# Patient Record
Sex: Female | Born: 1955 | Race: White | Hispanic: No | Marital: Married | Smoking: Never smoker
Health system: Southern US, Community
[De-identification: ages and names within clinical notes are randomized; demographics above are authoritative.]

## PROBLEM LIST (undated history)

## (undated) DIAGNOSIS — F1020 Alcohol dependence, uncomplicated: Secondary | ICD-10-CM

## (undated) DIAGNOSIS — D649 Anemia, unspecified: Secondary | ICD-10-CM

## (undated) DIAGNOSIS — F419 Anxiety disorder, unspecified: Secondary | ICD-10-CM

## (undated) DIAGNOSIS — K2270 Barrett's esophagus without dysplasia: Secondary | ICD-10-CM

## (undated) DIAGNOSIS — K219 Gastro-esophageal reflux disease without esophagitis: Secondary | ICD-10-CM

## (undated) DIAGNOSIS — K7460 Unspecified cirrhosis of liver: Secondary | ICD-10-CM

## (undated) HISTORY — DX: Barrett's esophagus without dysplasia: K22.70

## (undated) HISTORY — DX: Anxiety disorder, unspecified: F41.9

## (undated) HISTORY — DX: Anemia, unspecified: D64.9

## (undated) HISTORY — DX: Gastro-esophageal reflux disease without esophagitis: K21.9

---

## 1997-10-11 HISTORY — PX: APPENDECTOMY: SHX54

## 2003-10-12 HISTORY — PX: BILATERAL SALPINGOOPHORECTOMY: SHX1223

## 2003-10-12 HISTORY — PX: LAPAROSCOPIC ASSISTED VAGINAL HYSTERECTOMY: SHX5398

## 2007-10-17 ENCOUNTER — Ambulatory Visit: Payer: Self-pay | Admitting: Internal Medicine

## 2007-11-16 ENCOUNTER — Ambulatory Visit: Payer: Self-pay | Admitting: Internal Medicine

## 2007-11-16 DIAGNOSIS — Z8669 Personal history of other diseases of the nervous system and sense organs: Secondary | ICD-10-CM | POA: Insufficient documentation

## 2007-11-16 DIAGNOSIS — R12 Heartburn: Secondary | ICD-10-CM | POA: Insufficient documentation

## 2008-02-29 ENCOUNTER — Encounter: Payer: Self-pay | Admitting: Internal Medicine

## 2008-03-05 ENCOUNTER — Ambulatory Visit: Payer: Self-pay | Admitting: Hematology and Oncology

## 2008-03-10 ENCOUNTER — Encounter: Admission: RE | Admit: 2008-03-10 | Discharge: 2008-03-10 | Payer: Self-pay | Admitting: Family Medicine

## 2008-03-11 ENCOUNTER — Encounter: Admission: RE | Admit: 2008-03-11 | Discharge: 2008-03-11 | Payer: Self-pay | Admitting: Family Medicine

## 2008-03-15 ENCOUNTER — Encounter: Payer: Self-pay | Admitting: Internal Medicine

## 2008-03-15 LAB — CBC WITH DIFFERENTIAL/PLATELET
BASO%: 0.6 % (ref 0.0–2.0)
Eosinophils Absolute: 0.1 10*3/uL (ref 0.0–0.5)
MONO#: 0.4 10*3/uL (ref 0.1–0.9)
MONO%: 7.1 % (ref 0.0–13.0)
NEUT#: 3.9 10*3/uL (ref 1.5–6.5)
RBC: 3.78 10*6/uL (ref 3.70–5.32)
RDW: 13 % (ref 11.3–14.5)
WBC: 5.7 10*3/uL (ref 3.9–10.0)

## 2008-03-19 ENCOUNTER — Ambulatory Visit: Payer: Self-pay | Admitting: Internal Medicine

## 2008-03-19 DIAGNOSIS — R945 Abnormal results of liver function studies: Secondary | ICD-10-CM

## 2008-03-19 DIAGNOSIS — R7989 Other specified abnormal findings of blood chemistry: Secondary | ICD-10-CM | POA: Insufficient documentation

## 2008-03-19 DIAGNOSIS — R740 Nonspecific elevation of levels of transaminase and lactic acid dehydrogenase [LDH]: Secondary | ICD-10-CM

## 2008-03-19 DIAGNOSIS — K219 Gastro-esophageal reflux disease without esophagitis: Secondary | ICD-10-CM

## 2008-03-19 DIAGNOSIS — R7402 Elevation of levels of lactic acid dehydrogenase (LDH): Secondary | ICD-10-CM | POA: Insufficient documentation

## 2008-03-21 LAB — CBC WITH DIFFERENTIAL/PLATELET
Eosinophils Absolute: 0.1 10*3/uL (ref 0.0–0.5)
LYMPH%: 24 % (ref 14.0–48.0)
MONO#: 0.6 10*3/uL (ref 0.1–0.9)
NEUT#: 5.1 10*3/uL (ref 1.5–6.5)
Platelets: 249 10*3/uL (ref 145–400)
RBC: 3.66 10*6/uL — ABNORMAL LOW (ref 3.70–5.32)
WBC: 7.7 10*3/uL (ref 3.9–10.0)
lymph#: 1.9 10*3/uL (ref 0.9–3.3)

## 2008-03-22 LAB — HEMOCHROMATOSIS DNA-PCR(C282Y,H63D)

## 2008-03-22 LAB — HEPATITIS A ANTIBODY, TOTAL: Hep A Total Ab: NEGATIVE

## 2008-03-22 LAB — HEPATITIS B CORE ANTIBODY, TOTAL: Hep B Core Total Ab: NEGATIVE

## 2008-03-22 LAB — HEPATITIS C ANTIBODY: HCV Ab: NEGATIVE

## 2008-03-23 ENCOUNTER — Encounter: Admission: RE | Admit: 2008-03-23 | Discharge: 2008-03-23 | Payer: Self-pay | Admitting: Hematology and Oncology

## 2008-03-28 LAB — CBC WITH DIFFERENTIAL/PLATELET
Basophils Absolute: 0.1 10*3/uL (ref 0.0–0.1)
Eosinophils Absolute: 0.2 10*3/uL (ref 0.0–0.5)
HCT: 35.6 % (ref 34.8–46.6)
HGB: 12.4 g/dL (ref 11.6–15.9)
LYMPH%: 24.7 % (ref 14.0–48.0)
MCHC: 34.9 g/dL (ref 32.0–36.0)
MONO#: 0.5 10*3/uL (ref 0.1–0.9)
NEUT#: 5.5 10*3/uL (ref 1.5–6.5)
NEUT%: 66.5 % (ref 39.6–76.8)
Platelets: 297 10*3/uL (ref 145–400)
WBC: 8.2 10*3/uL (ref 3.9–10.0)
lymph#: 2 10*3/uL (ref 0.9–3.3)

## 2008-03-29 ENCOUNTER — Telehealth: Payer: Self-pay | Admitting: Internal Medicine

## 2008-04-01 LAB — CBC WITH DIFFERENTIAL/PLATELET
BASO%: 0.5 % (ref 0.0–2.0)
Basophils Absolute: 0 10*3/uL (ref 0.0–0.1)
EOS%: 2.3 % (ref 0.0–7.0)
HCT: 33.4 % — ABNORMAL LOW (ref 34.8–46.6)
HGB: 11.6 g/dL (ref 11.6–15.9)
LYMPH%: 25 % (ref 14.0–48.0)
MCH: 34.5 pg — ABNORMAL HIGH (ref 26.0–34.0)
MCHC: 34.7 g/dL (ref 32.0–36.0)
NEUT%: 68.1 % (ref 39.6–76.8)
Platelets: 268 10*3/uL (ref 145–400)

## 2008-04-04 LAB — CBC WITH DIFFERENTIAL/PLATELET
Basophils Absolute: 0 10*3/uL (ref 0.0–0.1)
EOS%: 4 % (ref 0.0–7.0)
Eosinophils Absolute: 0.2 10*3/uL (ref 0.0–0.5)
HGB: 11.3 g/dL — ABNORMAL LOW (ref 11.6–15.9)
LYMPH%: 34.5 % (ref 14.0–48.0)
MCH: 34.6 pg — ABNORMAL HIGH (ref 26.0–34.0)
MCV: 101 fL (ref 81.0–101.0)
MONO%: 7.3 % (ref 0.0–13.0)
NEUT#: 3.3 10*3/uL (ref 1.5–6.5)
Platelets: 258 10*3/uL (ref 145–400)
RBC: 3.26 10*6/uL — ABNORMAL LOW (ref 3.70–5.32)
RDW: 11.9 % (ref 11.3–14.5)

## 2008-05-05 ENCOUNTER — Ambulatory Visit: Payer: Self-pay | Admitting: Hematology and Oncology

## 2008-05-09 ENCOUNTER — Encounter: Payer: Self-pay | Admitting: Internal Medicine

## 2008-05-09 ENCOUNTER — Ambulatory Visit: Payer: Self-pay | Admitting: Internal Medicine

## 2008-05-10 LAB — CONVERTED CEMR LAB
ALT: 217 units/L — ABNORMAL HIGH (ref 0–35)
AST: 176 units/L — ABNORMAL HIGH (ref 0–37)
Albumin: 3.8 g/dL (ref 3.5–5.2)
Alkaline Phosphatase: 66 units/L (ref 39–117)
Bilirubin, Direct: 0.1 mg/dL (ref 0.0–0.3)
Total Bilirubin: 0.8 mg/dL (ref 0.3–1.2)
Total Protein: 6.7 g/dL (ref 6.0–8.3)

## 2008-05-15 ENCOUNTER — Encounter: Payer: Self-pay | Admitting: Internal Medicine

## 2008-05-20 ENCOUNTER — Telehealth: Payer: Self-pay | Admitting: Internal Medicine

## 2008-05-22 ENCOUNTER — Encounter: Payer: Self-pay | Admitting: Internal Medicine

## 2008-05-24 ENCOUNTER — Ambulatory Visit: Payer: Self-pay | Admitting: Internal Medicine

## 2008-05-30 ENCOUNTER — Telehealth: Payer: Self-pay | Admitting: Internal Medicine

## 2008-05-31 LAB — CONVERTED CEMR LAB
Albumin ELP: 60 % (ref 55.8–66.1)
Alpha-1-Globulin: 4.3 % (ref 2.9–4.9)
Alpha-2-Globulin: 9.7 % (ref 7.1–11.8)
Anti Nuclear Antibody(ANA): NEGATIVE
Beta Globulin: 6.7 % (ref 4.7–7.2)
Ceruloplasmin: 36 mg/dL (ref 21–63)
Gamma Globulin: 14.3 % (ref 11.1–18.8)
Total Protein, Serum Electrophoresis: 7.4 g/dL (ref 6.0–8.3)

## 2008-06-04 ENCOUNTER — Telehealth: Payer: Self-pay | Admitting: Internal Medicine

## 2008-06-12 ENCOUNTER — Telehealth: Payer: Self-pay | Admitting: Internal Medicine

## 2008-06-14 ENCOUNTER — Ambulatory Visit: Payer: Self-pay | Admitting: Internal Medicine

## 2008-06-18 ENCOUNTER — Telehealth (INDEPENDENT_AMBULATORY_CARE_PROVIDER_SITE_OTHER): Payer: Self-pay

## 2008-06-18 LAB — CONVERTED CEMR LAB
ALT: 186 units/L — ABNORMAL HIGH (ref 0–35)
AST: 144 units/L — ABNORMAL HIGH (ref 0–37)
Albumin: 4.1 g/dL (ref 3.5–5.2)
Alkaline Phosphatase: 76 units/L (ref 39–117)
Bilirubin, Direct: 0.1 mg/dL (ref 0.0–0.3)
Folate: 12 ng/mL
Iron: 46 ug/dL (ref 42–145)
Total Bilirubin: 0.8 mg/dL (ref 0.3–1.2)
Total Protein: 7.2 g/dL (ref 6.0–8.3)
Vitamin B-12: 364 pg/mL (ref 211–911)

## 2008-06-21 ENCOUNTER — Ambulatory Visit: Payer: Self-pay | Admitting: Internal Medicine

## 2008-06-24 LAB — CONVERTED CEMR LAB
ALT: 120 units/L — ABNORMAL HIGH (ref 0–35)
AST: 85 units/L — ABNORMAL HIGH (ref 0–37)
Albumin: 4 g/dL (ref 3.5–5.2)
Alkaline Phosphatase: 72 units/L (ref 39–117)
Bilirubin, Direct: 0.1 mg/dL (ref 0.0–0.3)
Ferritin: 122.2 ng/mL (ref 10.0–291.0)
Iron: 70 ug/dL (ref 42–145)
Saturation Ratios: 19.1 % — ABNORMAL LOW (ref 20.0–50.0)
Total Bilirubin: 0.9 mg/dL (ref 0.3–1.2)
Total Protein: 7 g/dL (ref 6.0–8.3)
Transferrin: 261.8 mg/dL (ref >=212.0)

## 2008-08-01 ENCOUNTER — Ambulatory Visit: Payer: Self-pay | Admitting: Internal Medicine

## 2008-08-02 ENCOUNTER — Telehealth: Payer: Self-pay | Admitting: Internal Medicine

## 2008-08-07 ENCOUNTER — Ambulatory Visit: Payer: Self-pay | Admitting: Internal Medicine

## 2008-08-07 ENCOUNTER — Encounter: Payer: Self-pay | Admitting: Internal Medicine

## 2008-08-09 LAB — CONVERTED CEMR LAB
ALT: 106 units/L — ABNORMAL HIGH (ref 0–35)
AST: 71 units/L — ABNORMAL HIGH (ref 0–37)
Albumin: 3.8 g/dL (ref 3.5–5.2)
Alkaline Phosphatase: 69 units/L (ref 39–117)
Bilirubin, Direct: 0.1 mg/dL (ref 0.0–0.3)
Total Bilirubin: 1 mg/dL (ref 0.3–1.2)
Total Protein: 7.1 g/dL (ref 6.0–8.3)

## 2008-08-15 ENCOUNTER — Encounter: Payer: Self-pay | Admitting: Internal Medicine

## 2009-08-18 ENCOUNTER — Emergency Department (HOSPITAL_COMMUNITY): Admission: EM | Admit: 2009-08-18 | Discharge: 2009-08-18 | Payer: Self-pay | Admitting: Emergency Medicine

## 2010-03-31 ENCOUNTER — Ambulatory Visit (HOSPITAL_COMMUNITY): Admission: RE | Admit: 2010-03-31 | Discharge: 2010-03-31 | Payer: Self-pay | Admitting: Psychiatry

## 2010-04-22 ENCOUNTER — Other Ambulatory Visit (HOSPITAL_COMMUNITY): Admission: RE | Admit: 2010-04-22 | Discharge: 2010-06-04 | Payer: Self-pay | Admitting: Psychiatry

## 2010-04-27 ENCOUNTER — Ambulatory Visit: Payer: Self-pay | Admitting: Psychiatry

## 2010-06-08 ENCOUNTER — Other Ambulatory Visit (HOSPITAL_COMMUNITY): Admission: RE | Admit: 2010-06-08 | Discharge: 2010-07-15 | Payer: Self-pay | Admitting: Psychiatry

## 2010-06-08 ENCOUNTER — Emergency Department (HOSPITAL_COMMUNITY): Admission: EM | Admit: 2010-06-08 | Discharge: 2010-06-08 | Payer: Self-pay | Admitting: Emergency Medicine

## 2010-07-28 ENCOUNTER — Emergency Department (HOSPITAL_COMMUNITY): Admission: EM | Admit: 2010-07-28 | Discharge: 2010-07-29 | Payer: Self-pay | Admitting: Emergency Medicine

## 2010-11-10 NOTE — Miscellaneous (Signed)
Summary: lab lfts  Clinical Lists Changes  Problems: Added new problem of NONSPEC ELEVATION OF LEVELS OF TRANSAMINASE/LDH (ICD-790.4) Orders: Added new Test order of TLB-Hepatic/Liver Function Pnl (80076-HEPATIC) - Signed

## 2010-12-16 ENCOUNTER — Other Ambulatory Visit: Payer: Self-pay | Admitting: Dermatology

## 2010-12-24 LAB — URINE DRUGS OF ABUSE SCREEN W ALC, ROUTINE (REF LAB)
Amphetamine Screen, Ur: NEGATIVE
Amphetamine Screen, Ur: NEGATIVE
Amphetamine Screen, Ur: NEGATIVE
Amphetamine Screen, Ur: NEGATIVE
Barbiturate Quant, Ur: NEGATIVE
Barbiturate Quant, Ur: NEGATIVE
Barbiturate Quant, Ur: NEGATIVE
Barbiturate Quant, Ur: NEGATIVE
Benzodiazepines.: POSITIVE — AB
Benzodiazepines.: NEGATIVE
Benzodiazepines.: NEGATIVE
Benzodiazepines.: NEGATIVE
Cocaine Metabolites: NEGATIVE
Cocaine Metabolites: NEGATIVE
Cocaine Metabolites: NEGATIVE
Cocaine Metabolites: NEGATIVE
Creatinine,U: 193.1 mg/dL
Creatinine,U: 35.8 mg/dL
Creatinine,U: 52.9 mg/dL
Creatinine,U: 61.1 mg/dL
Ethyl Alcohol: 10 mg/dL — ABNORMAL HIGH (ref <10)
Ethyl Alcohol: <10 mg/dL (ref <10)
Ethyl Alcohol: <10 mg/dL (ref <10)
Ethyl Alcohol: <10 mg/dL (ref <10)
Marijuana Metabolite: NEGATIVE
Marijuana Metabolite: NEGATIVE
Marijuana Metabolite: NEGATIVE
Marijuana Metabolite: NEGATIVE
Methadone: NEGATIVE
Methadone: NEGATIVE
Methadone: NEGATIVE
Methadone: NEGATIVE
Opiate Screen, Urine: NEGATIVE
Opiate Screen, Urine: NEGATIVE
Opiate Screen, Urine: NEGATIVE
Opiate Screen, Urine: NEGATIVE
Phencyclidine (PCP): NEGATIVE
Phencyclidine (PCP): NEGATIVE
Phencyclidine (PCP): NEGATIVE
Phencyclidine (PCP): NEGATIVE
Propoxyphene: NEGATIVE
Propoxyphene: NEGATIVE
Propoxyphene: NEGATIVE
Propoxyphene: NEGATIVE

## 2010-12-24 LAB — ETHANOL CONFIRM, URINE: Ethanol, Ur - Confirmation: 0.013 GMS% (ref ?–0.04)

## 2010-12-24 LAB — BENZODIAZEPINE, QUANTITATIVE, URINE
Alprazolam (GC/LC/MS), ur confirm: NEGATIVE NG/ML
Flurazepam GC/MS Conf: NEGATIVE NG/ML
Lorazepam UR QT: NEGATIVE NG/ML
Nordiazepam GC/MS Conf: NEGATIVE NG/ML
Oxazepam GC/MS Conf: NEGATIVE NG/ML
Temazepam GC/MS Conf: NEGATIVE NG/ML

## 2010-12-25 LAB — URINALYSIS, ROUTINE W REFLEX MICROSCOPIC
Bilirubin Urine: NEGATIVE
Glucose, UA: NEGATIVE mg/dL
Hgb urine dipstick: NEGATIVE
Ketones, ur: NEGATIVE mg/dL
Nitrite: NEGATIVE
Protein, ur: NEGATIVE mg/dL
Specific Gravity, Urine: 1.015 (ref 1.005–1.030)
Urobilinogen, UA: 1 mg/dL (ref 0.0–1.0)
pH: 7.5 (ref 5.0–8.0)

## 2010-12-25 LAB — HEPATIC FUNCTION PANEL
AST: 71 U/L — ABNORMAL HIGH (ref 0–37)
Bilirubin, Direct: 0.3 mg/dL (ref 0.0–0.3)

## 2010-12-25 LAB — RAPID URINE DRUG SCREEN, HOSP PERFORMED
Amphetamines: NOT DETECTED
Barbiturates: NOT DETECTED
Benzodiazepines: POSITIVE — AB
Cocaine: NOT DETECTED
Opiates: NOT DETECTED
Tetrahydrocannabinol: NOT DETECTED

## 2010-12-25 LAB — BASIC METABOLIC PANEL
BUN: 5 mg/dL — ABNORMAL LOW (ref 6–23)
CO2: 30 mEq/L (ref 19–32)
Calcium: 9.3 mg/dL (ref 8.4–10.5)
Chloride: 99 mEq/L (ref 96–112)
Creatinine, Ser: 0.67 mg/dL (ref 0.4–1.2)
GFR calc Af Amer: >60 mL/min (ref >60)
GFR calc non Af Amer: >60 mL/min (ref >60)
Glucose, Bld: 115 mg/dL — ABNORMAL HIGH (ref 70–99)
Potassium: 3.6 mEq/L (ref 3.5–5.1)
Sodium: 138 mEq/L (ref 135–145)

## 2010-12-25 LAB — URINE DRUGS OF ABUSE SCREEN W ALC, ROUTINE (REF LAB)
Amphetamine Screen, Ur: NEGATIVE
Cocaine Metabolites: NEGATIVE
Creatinine,U: 68.4 mg/dL
Methadone: NEGATIVE
Opiate Screen, Urine: NEGATIVE

## 2010-12-25 LAB — CBC
HCT: 37.2 % (ref 36.0–46.0)
MCHC: 34.6 g/dL (ref 30.0–36.0)
Platelets: 112 10*3/uL — ABNORMAL LOW (ref 150–400)
RDW: 16.3 % — ABNORMAL HIGH (ref 11.5–15.5)

## 2010-12-25 LAB — URINE MICROSCOPIC-ADD ON

## 2010-12-25 LAB — DIFFERENTIAL
Basophils Absolute: 0 10*3/uL (ref 0.0–0.1)
Basophils Relative: 1 % (ref 0–1)
Monocytes Absolute: 0.3 10*3/uL (ref 0.1–1.0)
Neutro Abs: 2.6 10*3/uL (ref 1.7–7.7)
Neutrophils Relative %: 63 % (ref 43–77)

## 2010-12-25 LAB — ETHANOL: Alcohol, Ethyl (B): <5 mg/dL (ref 0–10)

## 2010-12-26 LAB — URINE DRUGS OF ABUSE SCREEN W ALC, ROUTINE (REF LAB)
Amphetamine Screen, Ur: NEGATIVE
Amphetamine Screen, Ur: NEGATIVE
Cocaine Metabolites: NEGATIVE
Cocaine Metabolites: NEGATIVE
Creatinine,U: 18.3 mg/dL
Creatinine,U: 25.6 mg/dL
Ethyl Alcohol: <10 mg/dL (ref <10)
Opiate Screen, Urine: NEGATIVE
Opiate Screen, Urine: NEGATIVE

## 2011-02-23 NOTE — Assessment & Plan Note (Signed)
[facility]                         GASTROENTEROLOGY OFFICE NOTE   Heather Baldwin, Heather Baldwin                           MRN:          [id number]  DATE:11/16/2007                            DOB:          05-27-1956    CHIEF COMPLAINT:  Heartburn.   HISTORY:  This is a 55 year old white woman who moved here from [place] last summer, who presented for screening colonoscopy at the  direction of Dr. Collins Baldwin.  However, she scheduled an office visit to  discuss intermittent heartburn problems.  She has had this for about a  year.  Tums will reliably relieve it.  She does drink 3-4 cups of tea in  the morning and some iced tea.  There is no nicotine or smoking, or  other habits like that.  There is no dysphagia or bleeding.  She  describes some occasional gas and bloating.  Note that the heartburn may  be a little increased over the last year.  She is concerned because her  mother did have esophageal cancer, though her mother was a smoker, this  still has caught Heather Baldwin's attention.  She occasionally has had some  hemorrhoid problems, though not a problem at this time.   GI review of systems is otherwise negative per my medical history form.   PAST MEDICAL HISTORY:  1. Complete hysterectomy in 2005.  2. Appendectomy in 1999.   MEDICATIONS:  Multivitamin p.r.n.   ALLERGIES:  There are no known drug allergies.   FAMILY HISTORY:  Otherwise positive for heart disease in her father.   SOCIAL HISTORY:  She is married.  She is a housewife.  She has a  Master's degree, 3 daughters.  She does drink some red wine daily which  may promote some heartburn at times.  Otherwise no tobacco or drug use.   REVIEW OF SYSTEMS:  Occasional night sweats.  She does have a history of  some neuropathy, which has caused some swelling of the legs, she says.  All other systems are negative.   PHYSICAL EXAMINATION:  Well-developed, well-nourished middle aged white  woman.  Height 5  feet 7 inches.  Weight 166 pounds.  Blood pressure  134/88.  Pulse 76.  The eyes aer anicteric.  ENT, normal mouth.  Posterior pharynx, auditory  acuity is normal.  NECK:  Supple without thyromegaly or mass.  CHEST:  Clear.  HEART:  S1 and S2, no murmurs or gallops.  ABDOMEN:  Soft, nontender, no organomegaly or mass.  RECTAL:  Deferred until colonoscopy.  Note that on abdominal exam there  is a very early ventral hernia in the epigastrium and she has noted this  and she is reassured.  LYMPHATIC:  No neck or supraclavicular nodes.  LOWER EXTREMITIES:  I note no edema today.  SKIN:  She does have some erythema and some rosacea-like appearance to  the facies.  NEUROLOGIC:  Cranial nerves 2-12 grossly intact, nonfocal.  PSYCHIATRIC:  She is alert and oriented x3, appropriate mood and affect.   Review of Dr. Alda Berthold referral records show that she has had a recent  visit for right ear pain, September 27, 2007 and she had URI and  pharyngitis.  It was at that point she was referred for colonoscopy.   ASSESSMENT:  1. One year's worth of intermittent heartburn.  This is in the setting      of a family history of esophageal cancer.  2. [age] years of age, appropriate for average risk screening      colonoscopy.   PLAN:  1. Schedule EGD to investigate the heartburn, in light of the family      history of esophageal cancer.  We would want to make sure she does      not have Barrett's esophagus, as she is probably at higher risk for      this, given the family history.  2. Schedule screening colonoscopy.   Risks, benefits, and indications have been explained.  She will be given  a GERD lifestyle handout, where she can work on caffeine consumption.  Prescriptive therapy for GERD can be decided later, though she only uses  Tums once or twice a week.  Dietary modification may make a difference  depending upon what we see.   I appreciate the opportunity to care for this patient.      Iva Boop, MD,FACG  Electronically Signed    CEG/MedQ  DD: 11/16/2007  DT: 11/17/2007  Job #: [id number]   cc:   Heather Baldwin, M.D.

## 2011-09-01 ENCOUNTER — Encounter: Payer: Self-pay | Admitting: Internal Medicine

## 2011-12-03 ENCOUNTER — Other Ambulatory Visit: Payer: Self-pay | Admitting: Dermatology

## 2011-12-23 ENCOUNTER — Other Ambulatory Visit: Payer: Self-pay | Admitting: Dermatology

## 2012-01-06 ENCOUNTER — Other Ambulatory Visit: Payer: Self-pay | Admitting: Obstetrics and Gynecology

## 2012-07-05 ENCOUNTER — Encounter: Payer: Self-pay | Admitting: Internal Medicine

## 2012-07-21 HISTORY — PX: OTHER SURGICAL HISTORY: SHX169

## 2012-07-24 ENCOUNTER — Encounter: Payer: Self-pay | Admitting: Internal Medicine

## 2012-08-23 ENCOUNTER — Encounter (HOSPITAL_COMMUNITY): Payer: Self-pay | Admitting: Emergency Medicine

## 2012-08-23 ENCOUNTER — Emergency Department (HOSPITAL_COMMUNITY)
Admission: EM | Admit: 2012-08-23 | Discharge: 2012-08-23 | Disposition: A | Discharge status: Home / Self Care | Payer: BC Managed Care – PPO | Attending: Emergency Medicine | Admitting: Emergency Medicine

## 2012-08-23 DIAGNOSIS — T8131XA Disruption of external operation (surgical) wound, not elsewhere classified, initial encounter: Secondary | ICD-10-CM | POA: Insufficient documentation

## 2012-08-23 DIAGNOSIS — Y838 Other surgical procedures as the cause of abnormal reaction of the patient, or of later complication, without mention of misadventure at the time of the procedure: Secondary | ICD-10-CM | POA: Insufficient documentation

## 2012-08-23 DIAGNOSIS — T8130XA Disruption of wound, unspecified, initial encounter: Secondary | ICD-10-CM

## 2012-08-23 DIAGNOSIS — R040 Epistaxis: Secondary | ICD-10-CM | POA: Insufficient documentation

## 2012-08-23 LAB — URINALYSIS, ROUTINE W REFLEX MICROSCOPIC
Glucose, UA: NEGATIVE mg/dL
Hgb urine dipstick: NEGATIVE
Ketones, ur: NEGATIVE mg/dL
Protein, ur: NEGATIVE mg/dL
Urobilinogen, UA: 1 mg/dL (ref 0.0–1.0)

## 2012-08-23 LAB — CBC WITH DIFFERENTIAL/PLATELET
Basophils Absolute: 0 10*3/uL (ref 0.0–0.1)
Basophils Relative: 0 % (ref 0–1)
MCHC: 34.1 g/dL (ref 30.0–36.0)
Neutro Abs: 4.3 10*3/uL (ref 1.7–7.7)
Neutrophils Relative %: 63 % (ref 43–77)
RDW: 13.1 % (ref 11.5–15.5)

## 2012-08-23 LAB — PROTIME-INR: INR: 1.18 (ref 0.00–1.49)

## 2012-08-23 NOTE — ED Notes (Signed)
Pt has surgical wound to right foot that has large amount of bleeding this am; pt denies other complaint and no bleeding noted at present

## 2012-08-23 NOTE — ED Notes (Signed)
Pt also c/o feeling light headed this am, states has had before but worse now

## 2012-08-23 NOTE — ED Notes (Signed)
Pt with right foot wrapped in gauze, states bleeding this am, was taking ASA, stopped 10 days after surgery

## 2012-08-23 NOTE — ED Provider Notes (Signed)
History  This chart was scribed for Doug Sou, MD by Shari Heritage, ED Scribe. The patient was seen in room TR10C/TR10C. Patient's care was started at 1206.  CSN: [id number]  Arrival date & time 08/23/12  1034   First MD Initiated Contact with Patient 08/23/12 1206      Chief Complaint  Patient presents with  . Bleeding/Bruising     The history is provided by the patient. No language interpreter was used.    HPI Comments: Heather Baldwin is a 56 y.o. female who presents to the Emergency Department complaining of a significant amount of bleeding from a surgical wound on her right great toe onset this morning. There is associated lightheadedness and epistaxis. Patient states that she is still lightheaded right now, but she is improved. Patient reports having had nosebleeds every other day for the past 1-2 weeks. She denies a history of recurrent nosebleeds. Patient states that she had surgery with Dr. Lestine Box on October 11 after she fractured her toe. Patient states that she met Dr. Lestine Box at the surgical center this morning and he referred her to the ED. She says that she had a similar episode of bleeding several days ago while at Dr. Lestine Box office for a check up after he removed sutures from the surgical site.  Patient does not smoke. She has a history of alcohol abuse, but says she stopped drinking 9 weeks ago. She does not use any illegal drugs. Patient denies any other significant past medical or surgical history.   History  Substance Use Topics  . Smoking status: Never Smoker   . Smokeless tobacco: Not on file  . Alcohol Use: No    OB History    Grav Para Term Preterm Abortions TAB SAB Ect Mult Living                  Review of Systems  Constitutional: Negative.   HENT: Positive for nosebleeds.   Respiratory: Negative.   Cardiovascular: Negative.   Gastrointestinal: Negative.   Musculoskeletal: Negative.   Skin: Positive for wound.  Neurological: Positive for  light-headedness.  Hematological: Negative.   Psychiatric/Behavioral: Negative.    patient reports she's had nosebleeds for the past 2 or 3 weeks. No other history of easy bleeding  Allergies  Review of patient's allergies indicates no known allergies.  Home Medications   Current Outpatient Rx  Name  Route  Sig  Dispense  Refill  . CALCIUM CARBONATE-VITAMIN D 500-200 MG-UNIT PO TABS   Oral   Take 1 tablet by mouth daily.         Marland Kitchen HYDROCODONE-ACETAMINOPHEN 5-325 MG PO TABS   Oral   Take 1 tablet by mouth every 4 (four) hours as needed. For pain         . OMEPRAZOLE 40 MG PO CPDR   Oral   Take 40 mg by mouth daily.         Marland Kitchen PAROXETINE HCL 20 MG PO TABS   Oral   Take 20 mg by mouth daily.         Marland Kitchen VITAMIN B-1 100 MG PO TABS   Oral   Take 100 mg by mouth daily.           Triage Vitals: BP 128/77  Pulse 95  Temp 98.2 F (36.8 C) (Oral)  Resp 18  SpO2 99%  Physical Exam  Nursing note and vitals reviewed. Constitutional: She is oriented to person, place, and time. She appears well-developed and well-nourished. No  distress.  HENT:  Head: Normocephalic and atraumatic.  Eyes: EOM are normal. Pupils are equal, round, and reactive to light.  Neck: Neck supple. No tracheal deviation present.  Cardiovascular: Normal rate.   Pulmonary/Chest: Effort normal. No respiratory distress.  Abdominal: Soft. She exhibits no distension.  Musculoskeletal: Normal range of motion. She exhibits no edema.  Neurological: She is alert and oriented to person, place, and time. No sensory deficit.  Skin: Skin is warm and dry.       Wound dehiscence on great toe proximal phalanx, dorsal aspect and another one medially.  Psychiatric: She has a normal mood and affect. Her behavior is normal.   Nose normal appearing ED Course  Procedures (including critical care time) DIAGNOSTIC STUDIES: Oxygen Saturation is 99% on room air, normal by my interpretation.    COORDINATION OF  CARE: 12:24 PM- Patient informed of current plan for treatment and evaluation and agrees with plan at this time.    Results for orders placed during the hospital encounter of 08/23/12  URINALYSIS, ROUTINE W REFLEX MICROSCOPIC      Component Value Range   Color, Urine YELLOW  YELLOW   APPearance CLEAR  CLEAR   Specific Gravity, Urine 1.003 (*) 1.005 - 1.030   pH 6.5  5.0 - 8.0   Glucose, UA NEGATIVE  NEGATIVE mg/dL   Hgb urine dipstick NEGATIVE  NEGATIVE   Bilirubin Urine NEGATIVE  NEGATIVE   Ketones, ur NEGATIVE  NEGATIVE mg/dL   Protein, ur NEGATIVE  NEGATIVE mg/dL   Urobilinogen, UA 1.0  0.0 - 1.0 mg/dL   Nitrite NEGATIVE  NEGATIVE   Leukocytes, UA NEGATIVE  NEGATIVE  CBC WITH DIFFERENTIAL      Component Value Range   WBC 6.9  4.0 - 10.5 K/uL   RBC 3.27 (*) 3.87 - 5.11 MIL/uL   Hemoglobin 11.4 (*) 12.0 - 15.0 g/dL   HCT 78.2 (*) 95.6 - 21.3 %   MCV 102.1 (*) 78.0 - 100.0 fL   MCH 34.9 (*) 26.0 - 34.0 pg   MCHC 34.1  30.0 - 36.0 g/dL   RDW 08.6  57.8 - 46.9 %   Platelets 141 (*) 150 - 400 K/uL   Neutrophils Relative 63  43 - 77 %   Neutro Abs 4.3  1.7 - 7.7 K/uL   Lymphocytes Relative 27  12 - 46 %   Lymphs Abs 1.9  0.7 - 4.0 K/uL   Monocytes Relative 9  3 - 12 %   Monocytes Absolute 0.6  0.1 - 1.0 K/uL   Eosinophils Relative 2  0 - 5 %   Eosinophils Absolute 0.1  0.0 - 0.7 K/uL   Basophils Relative 0  0 - 1 %   Basophils Absolute 0.0  0.0 - 0.1 K/uL  PROTIME-INR      Component Value Range   Prothrombin Time 14.8  11.6 - 15.2 seconds   INR 1.18  0.00 - 1.49    No results found.   No diagnosis found.    MDM  Lab work reviewed. 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 Patient states that Dr. Collins [place], her PMD is aware of thromobcytopenia Attempted to call Dr.Bednarz to see if he wants to see patient today. No response. I feel that patient follow up with Dr. Lestine Box in the office is no active bleeding presently Diagnosis #1 wound  dehiscence #2 thrombocytopenia        I personally performed the services described in this documentation, which was  scribed in my presence. The recorded information has been reviewed and considered.   Doug Sou, MD 08/23/12 1431

## 2012-08-24 ENCOUNTER — Other Ambulatory Visit: Payer: Self-pay | Admitting: Oncology

## 2012-08-24 ENCOUNTER — Telehealth: Payer: Self-pay | Admitting: Oncology

## 2012-08-24 DIAGNOSIS — R58 Hemorrhage, not elsewhere classified: Secondary | ICD-10-CM | POA: Insufficient documentation

## 2012-08-24 NOTE — Telephone Encounter (Signed)
S/W pt in re NP appt 11/15 @ 2:30 w/Dr. Arline Asp  Referring Dr. Lestine Box Dx-Abn Bleeding

## 2012-08-25 ENCOUNTER — Other Ambulatory Visit (HOSPITAL_BASED_OUTPATIENT_CLINIC_OR_DEPARTMENT_OTHER): Payer: BC Managed Care – PPO | Admitting: Lab

## 2012-08-25 ENCOUNTER — Encounter: Payer: Self-pay | Admitting: Oncology

## 2012-08-25 ENCOUNTER — Ambulatory Visit (HOSPITAL_BASED_OUTPATIENT_CLINIC_OR_DEPARTMENT_OTHER): Payer: BC Managed Care – PPO | Admitting: Oncology

## 2012-08-25 ENCOUNTER — Ambulatory Visit (HOSPITAL_BASED_OUTPATIENT_CLINIC_OR_DEPARTMENT_OTHER): Payer: BC Managed Care – PPO

## 2012-08-25 VITALS — BP 123/78 | HR 92 | Temp 98.9°F | Resp 20 | Ht 67.0 in | Wt 157.9 lb

## 2012-08-25 DIAGNOSIS — R233 Spontaneous ecchymoses: Secondary | ICD-10-CM

## 2012-08-25 DIAGNOSIS — G609 Hereditary and idiopathic neuropathy, unspecified: Secondary | ICD-10-CM

## 2012-08-25 DIAGNOSIS — R040 Epistaxis: Secondary | ICD-10-CM

## 2012-08-25 DIAGNOSIS — R58 Hemorrhage, not elsewhere classified: Secondary | ICD-10-CM

## 2012-08-25 DIAGNOSIS — D539 Nutritional anemia, unspecified: Secondary | ICD-10-CM

## 2012-08-25 LAB — COMPREHENSIVE METABOLIC PANEL (CC13)
ALT: 34 U/L (ref 0–55)
BUN: 6 mg/dL — ABNORMAL LOW (ref 7.0–26.0)
CO2: 25 mEq/L (ref 22–29)
Creatinine: 0.7 mg/dL (ref 0.6–1.1)
Glucose: 124 mg/dl — ABNORMAL HIGH (ref 70–99)
Total Bilirubin: 1.07 mg/dL (ref 0.20–1.20)

## 2012-08-25 LAB — CBC WITH DIFFERENTIAL/PLATELET
BASO%: 0.4 % (ref 0.0–2.0)
EOS%: 2 % (ref 0.0–7.0)
LYMPH%: 21.8 % (ref 14.0–49.7)
MCH: 37.2 pg — ABNORMAL HIGH (ref 25.1–34.0)
MCHC: 35 g/dL (ref 31.5–36.0)
MONO#: 0.5 10*3/uL (ref 0.1–0.9)
Platelets: 171 10*3/uL (ref 145–400)
RBC: 3.05 10*6/uL — ABNORMAL LOW (ref 3.70–5.45)
WBC: 6.2 10*3/uL (ref 3.9–10.3)

## 2012-08-25 LAB — LACTATE DEHYDROGENASE (CC13): LDH: 145 U/L (ref 125–245)

## 2012-08-25 NOTE — Progress Notes (Signed)
This office note has been dictated.  #[id number]

## 2012-08-26 NOTE — Progress Notes (Signed)
CC:   Leonides Grills, M.D. Tammy R. Collins Scotland, M.D.  PROBLEM LIST: 1. Nonhealing wounds associated with abnormal bleeding in association     with open reduction internal fixation of a fracture involving the     proximal phalanx in the right great toe.  Fracture occurred on     06/30/2012.  Surgery was carried out by Dr. Sherri Rad on     07/21/2012. 2. History of peripheral sensory neuropathy involving the feet, dating     back to 2005. 3. History of Barrett's esophagus dating back to October 2009. 4. History of GERD. 5. History of abnormal liver function tests, specifically     transaminases, since 2011 intermittently. 6. History of possible alcohol excess. 7. Elevated red cell indices.  MEDICATIONS: 1. Hydrocodone/acetaminophen 5/325 one tablet twice daily as needed     for pain. 2. Prilosec 40 mg daily. 3. Paxil 20 mg daily. 4. Thiamin 100 mg 3 times a week.  SMOKING HISTORY:  The patient has never smoked cigarettes.  HISTORY:  Heather Baldwin is a 56 year old white, married female whom I am asked to see in consultation by Dr. Leonides Grills for abnormal healing and bleeding associated with patient's right foot following a fracture which the patient sustained on 06/30/2012 involving the proximal phalanx of the right great toe.  An open reduction internal fixation was carried  out on 07/21/2012.  Dr. Lestine Box was concerned about a bleeding disorder.  It should be noted at the outset that there is no family history of any bleeding tendencies.  This patient has undergone prior surgery, dental procedures without anything in her history to suggest abnormal bleeding. She does give a history of fairly heavy periods in the past.  Also some recent nosebleeds and some easy bruising.  The patient is accompanied by a friend today.  The present circumstances started when the patient fractured her right great toe on June 30, 2012.  She underwent open reduction, internal fixation on  07/21/2012. Apparently some screws were inserted.  The patient states that she had an uneventful course until a dressing change that occurred somewhere around October 21st. i.e., about 10 days following surgery.  She said that she was having no troubles with bleeding or bloody bandages. Apparently the stitches were removed, and while the patient was in Dr. Ashok Norris office, she developed what sounds like an arterial bleed. Blood was actually spurting out.  Pressure was applied for an extended period of time.  The bleeding apparently stopped.  Bandages were reapplied, and the patient noted some ongoing oozing for the next few days that required changes of bandages about twice a day.  On about October 25th while the patient was taking a shower, apparently she had a dehiscence.  The wounds, specifically 2 areas where the patient had incisions, had not been healing well.  The patient states she has been seeing Dr. Lestine Box on a fairly frequent basis.  He was concerned about healing problems related to diabetes mellitus.  He was concerned also about peripheral sensory neuropathy.  The patient has apparently seen Dr. Collins Scotland and has a normal A1c.  Somewhere during this period of time, the patient received a 2-week course of doxycycline. She says that she never had any fever.  She has had some redness but no purulent drainage.  On or about Wednesday, November 13th, the patient was apparently washing her foot when once again she encountered some fairly heavy bleeding from the medial incision.  She estimates that she may have  lost about a cup of blood.  Once again, there appeared to be what sounds like an arterial bleed with pumping of blood that coincided with beating of her heart. She has also had some oozing from the lateral incision.  The patient ultimately was seen in the emergency room on November 13th.  The emergency room report describes a wound dehiscence on the great toe dorsal aspect  and another 1 medially.  Apparently there was no active bleeding at the time she was seen in the emergency room.  On this last occasion on the 13th, the patient described spraying of blood that traveled about 1 foot.  Again, this sounds like an arterial bleed.  History is significant that in that the patient had a fracture of her left 2nd toe sometime in 2012.  This underwent surgical repair with what sounds like open reduction internal fixation with some pins.  Apparently the patient had no problems with this surgery.  As stated above, she is being referred today for ruling out for consideration of a bleeding disorder.  PAST MEDICAL HISTORY:  As outlined above.  The patient has no major medical problems other than the peripheral sensory neuropathy involving her feet, a history of Barrett's esophagus.  She is scheduled to undergo another endoscopy by Dr. Leone Payor in December.  She has had some abnormal liver function tests and a history of some excessive alcohol use, specifically wine.  At 1 point back in 2009, the patient was worked up for her peripheral neuropathy.  She was found to have a vitamin B12 level that was said to be low; however, the only value I could find in Epic was 364 from 06/14/2008.  On the other hand, the patient had been seen in May of 2009.  The patient received some vitamin B12 shots.  She was also noted to have a ferritin level that was over 1000.  She underwent some phlebotomies for that.  Apparently she did not have any mutations to suggest hemochromatosis.  It was felt that the increased ferritin may have been related to underlying liver disease due to alcohol.  SURGICAL HISTORY:  An appendectomy associated with peritonitis in 1999 when the patient was living in [place].  She underwent bilateral salpingo-oophorectomy and total abdominal hysterectomy due to excessive menstrual bleeding.  Prior to that, she had an endometrial ablation. Surgery occurred in  2005 in [place].  In 2004, the patient underwent arthroscopic surgery involving her left knee and in 2012 apparently surgery on the left 2nd toe by Dr. Lestine Box.  On none of these occasions was there any problems with wound healing or any unusual bleeding.  The patient denies ever having a blood transfusion or iron infusions.  No history of any allergies to any medicines.  FAMILY HISTORY:  Mother was treated for esophageal cancer, stage III, about 6 years ago with chemotherapy and radiation.  She is now 56 years old.  She has peripheral neuropathy.  Father died at age 22 of acute myeloid leukemia.  He had a myocardial infarction and coronary artery bypass surgery in the past.  There are 2 brothers who are in good health.  There are 3 children ages 20-26 who are in good health.  The patient has no grandchildren.  Again, no history of any bleeding tendencies or abnormal clotting.  SOCIAL HISTORY:  The patient denies any use of tobacco products.  She has not had any alcohol since September; however, for the past 20 or 30 years she has been drinking  wine on an almost daily basis, 3-4 glasses. She did not drink during her pregnancies.  She was born and raised in [place], married for 33 years.  They have lived in different locations due to the husband's job.  For the past 5 years they have been in [place].  The patient is a homemaker.  She drives.  She has been previously active around the home.  REVIEW OF SYSTEMS:  Significant for the peripheral neuropathy.  The patient has been having headaches for the last few weeks.  She also was noted some lightheadedness when bending over.  No loss of consciousness. She wears glasses.  Vision and hearing are good.  She denies any sinus problems.  She has been having some nosebleeds over the past couple of weeks from both nostrils, right greater than left.  She says she has not had a nosebleed in the last week.  She has had nosebleeds  previously during the winter.  Recommendations were made regarding humidification. She may need to see an ENT specialist if these continue.  She denies any difficulties with eating.  She does have reflux symptoms if she is not taking Prilosec.  She has had some constipation related to her pain medicine.  She has been worked up for hepatitis with negative tests. She has had elevated transaminases.  No cardiac or respiratory problems. She has regular mammograms.  No urinary symptoms.  She has no GYN complaints.  She is done with her hot flashes.  No vaginal bleeding.  In the past she has had no swelling of her legs, although she does have some swelling now of her right foot and ankle.  No history of any blood clots or phlebitis.  No history of any intermittent claudication.  As noted above, there has been no history of abnormal bleeding associated with either dental or surgical procedures or blood drawing.  The patient does have easy bruising, sometimes without obvious trauma.  She does have some low back and right hip discomfort.  No fever, chills, night sweats.  No skin rashes or pruritus.  She does admit to having anxiety.  PHYSICAL EXAMINATION:  The patient is very pleasant, generally well- appearing, and looks healthy.  Weight is 157.8 pounds.  Height 5 feet 7 inches.  Body surface area 1.84 sq m.  Blood pressure 123/78.  Other vital signs are normal.  Temperature is 98.9.  The patient wears glasses.  No scleral icterus.  Mouth and pharynx are benign.  She has a small torus palatinus.  There is no peripheral adenopathy palpable in the neck, supraclavicular, axillary, or inguinal areas.  Heart and lungs are normal.  Breasts are not examined.  Back:  No skeletal tenderness or deformity.  Abdomen:  Benign with well-healed scars from her appendectomy.  Extremities:  The patient was wearing an open-toe shoe. She removed the bandages.  There is some mild swelling of the ankle and more  extensive swelling of the foot and toes.  The right great toe is somewhat bronzed with blanching erythema.  There were 2 areas where there had been incisions with what looks like bubbled thrombus measuring approximately 2 or 3 cm.  There is an area of whiteness surrounding the lateral incision on the right toe.  The medial incision apparently is the one that has been demonstrated pulsatile bleeding.  There was some moisture over this clot which looked a little tenuous, but there was no true active bleeding demonstrated.  The possibility that the patient may have  a cellulitis or some other reaction going on in that right great toe cannot be excluded.  The patient did not have tenderness, however, she does have a peripheral neuropathy.  There was increased warmth around the toe.  As stated before by history, there has been no purulent drainage.  There was no clubbing, erythema, petechiae, or purpura. No obvious arthritic changes in the hands.  Neurologic:  Grossly normal.  LABORATORY DATA:  White count 6.2 with an ANC of 4.2, hemoglobin 11.4, hematocrit 32.5, platelets 171,000.  MCV was 106.3, MCH 37.2, both quite markedly elevated.  Chemistries today are pending.  However, the LDH was 145.  We have chemistries dating back to 06/08/2010 that were notable for an AST of 71, ALT of 78, and total bilirubin of 1.5 with indirect of 1.2.  Pro time from 08/23/2012 was 14.8 with an INR of 1.18.  Pending is a PTT and von Willebrand's panel.  Hepatitis panel from 03/15/2008 was entirely negative.  Hemochromatosis DNA panel showed a normal genotype on 03/15/2008.  IMAGING STUDIES:  No recent imaging studies have been obtained that are available to me in Epic.  IMPRESSION/PLAN:  I was asked to see this patient to evaluate her for a possible bleeding disorder that might provide some insight as to why this patient's incisions have not healed.  On today's exam I am concerned about the possibility of an  infection, specifically cellulitis.  The patient will try to reach Dr. Lestine Box or someone on- call from his practice.  If that is not possible, then she will have to go to the emergency room for further evaluation, specifically cultures, possibly hospitalization and intravenous antibiotics.  Apparently the appearance of the right foot has changed dramatically over the past 24 hours according to the patient.  In any event, I think that it is unlikely that this patient has a significant bleeding disorder that would account for the problems that she has had.  It sounds like she may have an exposed arteriole that intermittently bleeds, at least involving the medial incision.  The patient asked me whether she could be having some sort of allergic reaction to the pins or other materials that were placed at the time of the surgery on 07/21/2012.  I urged her to discuss this with Dr. Lestine Box.  At least in theory, that possibility certainly needs to be given consideration.  In addition, there is also a concern that there may be infection, such as osteomyelitis that might be accounting for the patient's current problems.  Of note today is the elevated red cell indices.  The patient apparently does have a history of receiving vitamin B12 shots.  She has not been on any vitamin B12 recently.  We will check the red cell folate and vitamin B12 level.  As stated we are awaiting the results of the von Willebrand's panel as well as a PTT. If these test come back normal, we may want to consider platelet function assays, an assay of factor XIII and consultation with the [facility] at [facility]. I have not made a return appointment for Ms. Faerber but would be happy to see her again, as indicated.  I will try to make contact with Dr. Lestine Box early next week.    ______________________________ Samul Dada, M.D. DSM/MEDQ  D:  08/25/2012  T:  08/26/2012  Job:  [id number]

## 2012-08-28 ENCOUNTER — Other Ambulatory Visit (HOSPITAL_COMMUNITY): Payer: Self-pay | Admitting: Orthopedic Surgery

## 2012-08-28 ENCOUNTER — Ambulatory Visit (HOSPITAL_COMMUNITY)
Admission: RE | Admit: 2012-08-28 | Discharge: 2012-08-28 | Disposition: A | Discharge status: Home / Self Care | Payer: BC Managed Care – PPO | Source: Ambulatory Visit | Attending: Orthopedic Surgery | Admitting: Orthopedic Surgery

## 2012-08-28 ENCOUNTER — Telehealth: Payer: Self-pay

## 2012-08-28 ENCOUNTER — Encounter (HOSPITAL_COMMUNITY): Payer: Self-pay

## 2012-08-28 ENCOUNTER — Other Ambulatory Visit: Payer: Self-pay | Admitting: Oncology

## 2012-08-28 DIAGNOSIS — R58 Hemorrhage, not elsewhere classified: Secondary | ICD-10-CM

## 2012-08-28 DIAGNOSIS — D539 Nutritional anemia, unspecified: Secondary | ICD-10-CM

## 2012-08-28 DIAGNOSIS — L089 Local infection of the skin and subcutaneous tissue, unspecified: Secondary | ICD-10-CM

## 2012-08-28 DIAGNOSIS — M869 Osteomyelitis, unspecified: Secondary | ICD-10-CM | POA: Insufficient documentation

## 2012-08-28 LAB — VON WILLEBRAND PANEL: Ristocetin Co-factor, Plasma: 136 % (ref 42–200)

## 2012-08-28 LAB — VITAMIN B12: Vitamin B-12: 488 pg/mL (ref 211–911)

## 2012-08-28 LAB — PROTHROMBIN TIME
INR: 1.17 (ref <1.50)
Prothrombin Time: 14.8 seconds (ref 11.6–15.2)

## 2012-08-28 LAB — APTT: aPTT: 41 seconds — ABNORMAL HIGH (ref 24–37)

## 2012-08-28 MED ORDER — SODIUM CHLORIDE 0.9 % IJ SOLN
10.0000 mL | Freq: Once | INTRAMUSCULAR | Status: AC
  Administered 2012-08-28: 10 mL via INTRAVENOUS

## 2012-08-28 MED ORDER — VANCOMYCIN HCL IN DEXTROSE 1-5 GM/200ML-% IV SOLN
1000.0000 mg | Freq: Once | INTRAVENOUS | Status: AC
  Administered 2012-08-28: 1000 mg via INTRAVENOUS
  Filled 2012-08-28: qty 200

## 2012-08-28 MED ORDER — LIDOCAINE HCL 1 % IJ SOLN
INTRAMUSCULAR | Status: AC
  Filled 2012-08-28: qty 20

## 2012-08-28 MED ORDER — HEPARIN SOD (PORK) LOCK FLUSH 100 UNIT/ML IV SOLN
500.0000 [IU] | INTRAVENOUS | Status: DC | PRN
  Administered 2012-08-28: 500 [IU] via INTRAVENOUS
  Filled 2012-08-28: qty 5

## 2012-08-28 MED ORDER — SODIUM CHLORIDE 0.9 % IV SOLN
Freq: Once | INTRAVENOUS | Status: AC
  Administered 2012-08-28: 17:00:00 via INTRAVENOUS

## 2012-08-28 NOTE — Telephone Encounter (Signed)
error 

## 2012-08-28 NOTE — Telephone Encounter (Signed)
Pt called requesting labs from 11/15. Discussed a few over phone and pt said she is coming to [facility] for a PICC today and I suggested she get a copy of her labs at that time. She has an appt w/her PCP soon. She stated that the orthopedic surgeon did debride her foot today and set up the PICC to get BID antibiotics at home.

## 2012-08-28 NOTE — Procedures (Signed)
Interventional Radiology Procedure Note  Procedure: Placement of 38 cm right brachial vein single lumen PowerPICC.  Catheter tip at superior cavoatrial junction and ready for use.  Complications: None Recommendations: - Routine line care  Signed,  Sterling Big, MD Vascular & Interventional Radiologist [facility]

## 2012-08-29 ENCOUNTER — Telehealth: Payer: Self-pay | Admitting: Oncology

## 2012-08-29 ENCOUNTER — Ambulatory Visit (HOSPITAL_BASED_OUTPATIENT_CLINIC_OR_DEPARTMENT_OTHER): Payer: BC Managed Care – PPO | Admitting: Lab

## 2012-08-29 ENCOUNTER — Other Ambulatory Visit: Payer: Self-pay

## 2012-08-29 DIAGNOSIS — R58 Hemorrhage, not elsewhere classified: Secondary | ICD-10-CM

## 2012-08-29 DIAGNOSIS — D539 Nutritional anemia, unspecified: Secondary | ICD-10-CM

## 2012-08-29 NOTE — Telephone Encounter (Signed)
Lab appt made and printed for pt   For 11/20   aom

## 2012-08-30 ENCOUNTER — Other Ambulatory Visit: Payer: BC Managed Care – PPO

## 2012-08-30 ENCOUNTER — Telehealth: Payer: Self-pay

## 2012-08-30 DIAGNOSIS — R58 Hemorrhage, not elsewhere classified: Secondary | ICD-10-CM

## 2012-08-30 DIAGNOSIS — D539 Nutritional anemia, unspecified: Secondary | ICD-10-CM

## 2012-08-30 NOTE — Telephone Encounter (Signed)
S/w pt that Dr Arline Asp started his lab search with Zenaida Niece Willebrand test, this came back negative so he had to add on other labs to look further for a reason for her wound problem. These may also come back negative but he is looking. Pt expressed understanding and is looking forward to hearing back with results

## 2012-08-31 ENCOUNTER — Encounter: Payer: Self-pay | Admitting: Internal Medicine

## 2012-08-31 ENCOUNTER — Telehealth: Payer: Self-pay | Admitting: *Deleted

## 2012-08-31 ENCOUNTER — Ambulatory Visit (AMBULATORY_SURGERY_CENTER): Payer: BC Managed Care – PPO | Admitting: *Deleted

## 2012-08-31 VITALS — Ht 67.0 in | Wt 160.0 lb

## 2012-08-31 DIAGNOSIS — K2270 Barrett's esophagus without dysplasia: Secondary | ICD-10-CM

## 2012-08-31 NOTE — Progress Notes (Signed)
Dr. Gessner, Heather Baldwin had a PIC line inserted in her Right upper arm on Monday 08/28/12 to administer Vancomycin 2 G BID for an infection in her Right foot following surgery.  She is to be on the Vancomycin for 4 weeks.  Please advise if you want to delay her EGD for Barrett's scheduled on 09/20/12.  Thank you  

## 2012-08-31 NOTE — Telephone Encounter (Signed)
I spoke with Ms. Wynes and she will wait until her foot is healed to [facility] her procedure.

## 2012-08-31 NOTE — Telephone Encounter (Signed)
I recommend she wait until she is better though do not think it is wrong to do while she is on the vancomycin - we may not be able to use the Picc in [facility]

## 2012-08-31 NOTE — Telephone Encounter (Signed)
Dr. Leone Payor, Heather Baldwin had a PIC line inserted in her Right upper arm on Monday 08/28/12 to administer Vancomycin 2 G BID for an infection in her Right foot following surgery.  She is to be on the Vancomycin for 4 weeks.  Please advise if you want to delay her EGD for Barrett's scheduled on 09/20/12.  Thank you

## 2012-09-06 LAB — FIBRINOGEN: Fibrinogen: 311 mg/dL (ref 204–475)

## 2012-09-08 LAB — PLATELET AGGREGATION STUDY, BLOOD
ADP10: NORMAL
Ristocetin: NORMAL

## 2012-09-11 ENCOUNTER — Ambulatory Visit: Payer: Self-pay | Admitting: Nurse Practitioner

## 2012-09-11 ENCOUNTER — Encounter: Payer: Self-pay | Admitting: Cardiothoracic Surgery

## 2012-09-11 ENCOUNTER — Encounter: Payer: Self-pay | Admitting: Nurse Practitioner

## 2012-09-11 LAB — RENAL FUNCTION PANEL
Anion Gap: 8 (ref 7–16)
Calcium, Total: 9 mg/dL (ref 8.5–10.1)
EGFR (African American): >60
Glucose: 89 mg/dL (ref 65–99)
Osmolality: 276 (ref 275–301)
Sodium: 140 mmol/L (ref 136–145)

## 2012-09-20 ENCOUNTER — Encounter: Payer: Self-pay | Admitting: Internal Medicine

## 2012-10-11 ENCOUNTER — Encounter: Payer: Self-pay | Admitting: Cardiothoracic Surgery

## 2012-10-11 ENCOUNTER — Encounter: Payer: Self-pay | Admitting: Nurse Practitioner

## 2012-11-08 ENCOUNTER — Encounter: Payer: Self-pay | Admitting: Internal Medicine

## 2012-11-08 ENCOUNTER — Ambulatory Visit (AMBULATORY_SURGERY_CENTER): Payer: BC Managed Care – PPO | Admitting: Internal Medicine

## 2012-11-08 VITALS — BP 113/70 | HR 88 | Temp 98.1°F | Resp 18 | Ht 67.0 in | Wt 160.0 lb

## 2012-11-08 DIAGNOSIS — K209 Esophagitis, unspecified: Secondary | ICD-10-CM

## 2012-11-08 DIAGNOSIS — K2270 Barrett's esophagus without dysplasia: Secondary | ICD-10-CM

## 2012-11-08 MED ORDER — SODIUM CHLORIDE 0.9 % IV SOLN: 500.0000 mL | INTRAVENOUS | Status: DC

## 2012-11-08 NOTE — Progress Notes (Signed)
Pt. Monitored thirty minutes post procedure. Sinus rhythym, with stable blood pressure and pulse ox. Color pink with no complaints Throughout post procedure period.

## 2012-11-08 NOTE — Op Note (Signed)
[facility] [address] [place], [address]   ENDOSCOPY PROCEDURE REPORT  PATIENT: Heather Baldwin, Heather Baldwin  MR#: [id number] BIRTHDATE: 02/06/1956 , 56  yrs. old GENDER: Female ENDOSCOPIST: Iva Boop, MD, [facility] PROCEDURE DATE:  11/08/2012 PROCEDURE:  EGD w/ biopsy ASA CLASS:     Class II INDICATIONS:  follow up of Barrett's esophagus. MEDICATIONS: propofol (Diprivan) 300mg  IV, MAC sedation, administered by CRNA, and These medications were titrated to patient response per physician's verbal order TOPICAL ANESTHETIC: Cetacaine Spray  DESCRIPTION OF PROCEDURE: After the risks benefits and alternatives of the procedure were thoroughly explained, informed consent was obtained.  The LB GIF-H180 [id number] endoscope was introduced through the mouth and advanced to the second portion of the duodenum. Without limitations.  The instrument was slowly withdrawn as the mucosa was fully examined.        ESOPHAGUS: There was evidence of Barrett's esophagus at the gastroesophageal junction.  3 tongues not greater tan 1 cm long, Z-line at 40 cm. Multiple biopsies were performed using cold forceps.  Sample sent for histology.  The remainder of the upper endoscopy exam was notable for mild antral gastritis (erythema), otherwise normal.  Retroflexed views revealed no abnormalities.     The scope was then withdrawn from the patient and the procedure completed.  COMPLICATIONS: There were no complications. ENDOSCOPIC IMPRESSION: 1.   There was evidence of Barrett's esophagus; multiple biopsies taken 2.   The remainder of the upper endoscopy exam was otherwise normal except for mild antral gastritis.  RECOMMENDATIONS: 1.  Await pathology results 2.  Continue PPI  REPEAT EXAM: for EGD pending biopsy results.  eSigned:  Iva Boop, MD, [facility] 11/08/2012 4:00 PM  XB:JYNWG Collins Scotland, MD and The Patient

## 2012-11-08 NOTE — Patient Instructions (Addendum)
The esophagus looked the same - nothing worrisome to the eye but biopsies taken and I will let you know the results and plans by mail.  Thank you for choosing me and [facility].  Iva Boop, MD, FACG YOU HAD AN ENDOSCOPIC PROCEDURE TODAY AT THE [facility]: Refer to the procedure report that was given to you for any specific questions about what was found during the examination.  If the procedure report does not answer your questions, please call your gastroenterologist to clarify.  If you requested that your care partner not be given the details of your procedure findings, then the procedure report has been included in a sealed envelope for you to review at your convenience later.  YOU SHOULD EXPECT: Some feelings of bloating in the abdomen. Passage of more gas than usual.  Walking can help get rid of the air that was put into your GI tract during the procedure and reduce the bloating. If you had a lower endoscopy (such as a colonoscopy or flexible sigmoidoscopy) you may notice spotting of blood in your stool or on the toilet paper. If you underwent a bowel prep for your procedure, then you may not have a normal bowel movement for a few days.  DIET: Your first meal following the procedure should be a light meal and then it is ok to progress to your normal diet.  A half-sandwich or bowl of soup is an example of a good first meal.  Heavy or fried foods are harder to digest and may make you feel nauseous or bloated.  Likewise meals heavy in dairy and vegetables can cause extra gas to form and this can also increase the bloating.  Drink plenty of fluids but you should avoid alcoholic beverages for 24 hours.  ACTIVITY: Your care partner should take you home directly after the procedure.  You should plan to take it easy, moving slowly for the rest of the day.  You can resume normal activity the day after the procedure however you should NOT DRIVE or use heavy machinery for 24  hours (because of the sedation medicines used during the test).    SYMPTOMS TO REPORT IMMEDIATELY: A gastroenterologist can be reached at any hour.  During normal business hours, 8:30 AM to 5:00 PM Monday through Friday, call [phone].  After hours and on weekends, please call the GI answering service at [phone] who will take a message and have the physician on call contact you.  er  Following upper endoscopy (EGD)  Vomiting of blood or coffee ground material  New chest pain or pain under the shoulder blades  Painful or persistently difficult swallowing  New shortness of breath  Fever of 100F or higher  Black, tarry-looking stools  FOLLOW UP: If any biopsies were taken you will be contacted by phone or by letter within the next 1-3 weeks.  Call your gastroenterologist if you have not heard about the biopsies in 3 weeks.  Our staff will call the home number listed on your records the next business day following your procedure to check on you and address any questions or concerns that you may have at that time regarding the information given to you following your procedure. This is a courtesy call and so if there is no answer at the home number and we have not heard from you through the emergency physician on call, we will assume that you have returned to your regular daily activities without incident.  SIGNATURES/CONFIDENTIALITY: You  and/or your care partner have signed paperwork which will be entered into your electronic medical record.  These signatures attest to the fact that that the information above on your After Visit Summary has been reviewed and is understood.  Full responsibility of the confidentiality of this discharge information lies with you and/or your care-partner.    Barrett's esophagus information given.   Gastritis information given. Continue PPI

## 2012-11-08 NOTE — Progress Notes (Signed)
Patient did not experience any of the following events: a burn prior to discharge; a fall within the facility; wrong site/side/patient/procedure/implant event; or a hospital transfer or hospital admission upon discharge from the facility. (G8907) Patient did not have preoperative order for IV antibiotic SSI prophylaxis. (G8918)  

## 2012-11-09 ENCOUNTER — Telehealth: Payer: Self-pay | Admitting: *Deleted

## 2012-11-09 NOTE — Telephone Encounter (Signed)
  Follow up Call-  Call back number 11/08/2012  Post procedure Call Back phone  # [phone]  Permission to leave phone message Yes     Patient questions:  Do you have a fever, pain , or abdominal swelling? no Pain Score  0 *  Have you tolerated food without any problems? yes  Have you been able to return to your normal activities? yes  Do you have any questions about your discharge instructions: Diet   no Medications  no Follow up visit  no  Do you have questions or concerns about your Care? no  Actions: * If pain score is 4 or above: No action needed, pain <4.

## 2012-11-16 ENCOUNTER — Encounter: Payer: Self-pay | Admitting: Internal Medicine

## 2012-11-16 NOTE — Progress Notes (Signed)
Quick Note:  No intestinal metaplasia No barrett;s Recall office visit 2 years ______

## 2012-12-07 ENCOUNTER — Emergency Department (HOSPITAL_BASED_OUTPATIENT_CLINIC_OR_DEPARTMENT_OTHER)
Admission: EM | Admit: 2012-12-07 | Discharge: 2012-12-07 | Disposition: A | Discharge status: Home / Self Care | Payer: BC Managed Care – PPO | Attending: Emergency Medicine | Admitting: Emergency Medicine

## 2012-12-07 ENCOUNTER — Encounter (HOSPITAL_BASED_OUTPATIENT_CLINIC_OR_DEPARTMENT_OTHER): Payer: Self-pay

## 2012-12-07 ENCOUNTER — Emergency Department (HOSPITAL_BASED_OUTPATIENT_CLINIC_OR_DEPARTMENT_OTHER): Payer: BC Managed Care – PPO

## 2012-12-07 DIAGNOSIS — Z862 Personal history of diseases of the blood and blood-forming organs and certain disorders involving the immune mechanism: Secondary | ICD-10-CM | POA: Insufficient documentation

## 2012-12-07 DIAGNOSIS — S0003XA Contusion of scalp, initial encounter: Secondary | ICD-10-CM | POA: Insufficient documentation

## 2012-12-07 DIAGNOSIS — R000 Tachycardia, unspecified: Secondary | ICD-10-CM | POA: Insufficient documentation

## 2012-12-07 DIAGNOSIS — R259 Unspecified abnormal involuntary movements: Secondary | ICD-10-CM | POA: Insufficient documentation

## 2012-12-07 DIAGNOSIS — Z79899 Other long term (current) drug therapy: Secondary | ICD-10-CM | POA: Insufficient documentation

## 2012-12-07 DIAGNOSIS — W1809XA Striking against other object with subsequent fall, initial encounter: Secondary | ICD-10-CM | POA: Insufficient documentation

## 2012-12-07 DIAGNOSIS — Y939 Activity, unspecified: Secondary | ICD-10-CM | POA: Insufficient documentation

## 2012-12-07 DIAGNOSIS — F411 Generalized anxiety disorder: Secondary | ICD-10-CM | POA: Insufficient documentation

## 2012-12-07 DIAGNOSIS — W1789XA Other fall from one level to another, initial encounter: Secondary | ICD-10-CM | POA: Insufficient documentation

## 2012-12-07 DIAGNOSIS — K219 Gastro-esophageal reflux disease without esophagitis: Secondary | ICD-10-CM | POA: Insufficient documentation

## 2012-12-07 DIAGNOSIS — K2270 Barrett's esophagus without dysplasia: Secondary | ICD-10-CM | POA: Insufficient documentation

## 2012-12-07 DIAGNOSIS — Y92009 Unspecified place in unspecified non-institutional (private) residence as the place of occurrence of the external cause: Secondary | ICD-10-CM | POA: Insufficient documentation

## 2012-12-07 HISTORY — DX: Alcohol dependence, uncomplicated: F10.20

## 2012-12-07 MED ORDER — LORAZEPAM 1 MG PO TABS: 1.0000 mg | ORAL_TABLET | Freq: Three times a day (TID) | ORAL | Status: DC | PRN

## 2012-12-07 MED ORDER — LORAZEPAM 1 MG PO TABS
2.0000 mg | ORAL_TABLET | Freq: Once | ORAL | Status: AC
  Administered 2012-12-07: 2 mg via ORAL
  Filled 2012-12-07: qty 2

## 2012-12-07 NOTE — ED Provider Notes (Signed)
History     CSN: [id number]  Arrival date & time 12/07/12  1034   First MD Initiated Contact with Patient 12/07/12 1144      Chief Complaint  Patient presents with  . Head Injury    (Consider location/radiation/quality/duration/timing/severity/associated sxs/prior treatment) HPI Comments: Also patient states that she has been very anxious over the last few days. She suffers from anxiety regularly and is currently not taking any medication for it. She states she drinks regularly every day and is an alcoholic. However she denies wanting to quit at this time she unfortunately sustained many falls due to her alcohol use. She finally came in today because it was making her nervous from a fall in which she wanted to make sure everything was okay  Patient is a 57 y.o. female presenting with fall. The history is provided by the patient.  Fall Incident onset: 5 days ago. Fall occurred: Patient states that she is an alcoholic compounds regularly. States she fell on Sunday and hit her head most likely on the mantle. She fell from a height of 1 to 2 ft. Impact surface: The mantle. There was no blood loss. The point of impact was the head (Left-sided face). Pain location: Left face. The pain is at a severity of 3/10. The pain is mild. She was ambulatory at the scene. There was no entrapment after the fall. There was alcohol use involved in the accident. Pertinent negatives include no visual change, no fever, no numbness, no abdominal pain, no bowel incontinence, no vomiting and no headaches. Associated symptoms comments: Unknown LOC. Exacerbated by: nothing. She has tried nothing for the symptoms. The treatment provided moderate relief.    Past Medical History  Diagnosis Date  . Anxiety   . Anemia     trace  . GERD (gastroesophageal reflux disease)   . Barrett's esophagus   . Alcoholic     Past Surgical History  Procedure Laterality Date  . Appendectomy  1999  . Laparoscopic assisted vaginal  hysterectomy  2005  . Bilateral salpingoophorectomy  2005  . Right great toe surgery  07/21/2012    Family History  Problem Relation Age of Onset  . Esophageal cancer Mother     History  Substance Use Topics  . Smoking status: Never Smoker   . Smokeless tobacco: Never Used  . Alcohol Use: 0.0 oz/week     Comment: pt reports she is an alcoholic and bonge drinks- she has been on an 8 day binge drinking as much as 3 bottles of wine daily- last drink was yesterday    OB History   Grav Para Term Preterm Abortions TAB SAB Ect Mult Living                  Review of Systems  Constitutional: Negative for fever.  Gastrointestinal: Negative for vomiting, abdominal pain and bowel incontinence.  Neurological: Negative for numbness and headaches.  All other systems reviewed and are negative.    Allergies  Review of patient's allergies indicates no known allergies.  Home Medications   Current Outpatient Rx  Name  Route  Sig  Dispense  Refill  . HYDROcodone-acetaminophen (NORCO/VICODIN) 5-325 MG per tablet   Oral   Take 1 tablet by mouth every 4 (four) hours as needed. For pain         . hydroxychloroquine (PLAQUENIL) 200 MG tablet               . omeprazole (PRILOSEC) 40 MG capsule  Oral   Take 40 mg by mouth daily.         Marland Kitchen PARoxetine (PAXIL) 20 MG tablet   Oral   Take 20 mg by mouth daily.         Marland Kitchen thiamine (VITAMIN B-1) 100 MG tablet   Oral   Take 100 mg by mouth daily. Pt takes about 3 times per week           BP 133/73  Pulse 107  Temp(Src) 98.8 F (37.1 C) (Oral)  Resp 20  Ht 5\' 7"  (1.702 m)  Wt 155 lb (70.308 kg)  BMI 24.27 kg/m2  SpO2 96%  Physical Exam  Nursing note and vitals reviewed. Constitutional: She is oriented to person, place, and time. She appears well-developed and well-nourished. She appears distressed.  HENT:  Head: Normocephalic. Head is with contusion.    Mouth/Throat: Oropharynx is clear and moist.  Eyes:  Conjunctivae and EOM are normal. Pupils are equal, round, and reactive to light.  Neck: Normal range of motion. Neck supple. No spinous process tenderness and no muscular tenderness present.  Cardiovascular: Regular rhythm and intact distal pulses.  Tachycardia present.   No murmur heard. Pulmonary/Chest: Effort normal and breath sounds normal. No respiratory distress. She has no wheezes. She has no rales.  Abdominal: Soft. She exhibits no distension. There is no tenderness. There is no rebound and no guarding.  Musculoskeletal: Normal range of motion. She exhibits no edema and no tenderness.  Neurological: She is alert and oriented to person, place, and time.  Skin: Skin is warm and dry. No rash noted. No erythema.  Psychiatric: Her behavior is normal. Her mood appears anxious.  Shaky hands and feet.  Appears very nervous    ED Course  Procedures (including critical care time)  Labs Reviewed - No data to display Ct Head Wo Contrast  12/07/2012  *RADIOLOGY REPORT*  Clinical Data:  History a history of fall with injury to the head.  CT HEAD WITHOUT CONTRAST CT MAXILLOFACIAL WITHOUT CONTRAST  Technique:  Multidetector CT imaging of the head and maxillofacial structures were performed using the standard protocol without intravenous contrast. Multiplanar CT image reconstructions of the maxillofacial structures were also generated.  Comparison:  Head CT 07/18/2010.  CT HEAD  Findings: No acute displaced skull fractures are identified.  No acute intracranial abnormality.  Specifically, no evidence of acute post-traumatic intracranial hemorrhage, no definite regions of acute/subacute cerebral ischemia, no focal mass, mass effect, hydrocephalus or abnormal intra or extra-axial fluid collections. The visualized paranasal sinuses and mastoids are well pneumatized.  IMPRESSION: 1.  No acute displaced skull fractures or acute intracranial abnormalities. 2.  The appearance of the brain is normal.  CT  MAXILLOFACIAL  Findings:   No acute displaced facial bone fractures.  Study is limited by motion and which particularly affects assessment of the mandibular condyles bilaterally. The mandibular condyles are favored to be located and intact.  Pterygoid plates are intact. Paranasal sinuses are well pneumatized.  IMPRESSION: 1. Limited examination secondary to extensive gross patient motion and demonstrating no definite acute displaced facial bone fractures.   Original Report Authenticated By: Trudie Reed, M.D.    Ct Maxillofacial Wo Cm  12/07/2012  *RADIOLOGY REPORT*  Clinical Data:  History a history of fall with injury to the head.  CT HEAD WITHOUT CONTRAST CT MAXILLOFACIAL WITHOUT CONTRAST  Technique:  Multidetector CT imaging of the head and maxillofacial structures were performed using the standard protocol without intravenous contrast. Multiplanar CT image  reconstructions of the maxillofacial structures were also generated.  Comparison:  Head CT 07/18/2010.  CT HEAD  Findings: No acute displaced skull fractures are identified.  No acute intracranial abnormality.  Specifically, no evidence of acute post-traumatic intracranial hemorrhage, no definite regions of acute/subacute cerebral ischemia, no focal mass, mass effect, hydrocephalus or abnormal intra or extra-axial fluid collections. The visualized paranasal sinuses and mastoids are well pneumatized.  IMPRESSION: 1.  No acute displaced skull fractures or acute intracranial abnormalities. 2.  The appearance of the brain is normal.  CT MAXILLOFACIAL  Findings:   No acute displaced facial bone fractures.  Study is limited by motion and which particularly affects assessment of the mandibular condyles bilaterally. The mandibular condyles are favored to be located and intact.  Pterygoid plates are intact. Paranasal sinuses are well pneumatized.  IMPRESSION: 1. Limited examination secondary to extensive gross patient motion and demonstrating no definite acute  displaced facial bone fractures.   Original Report Authenticated By: Trudie Reed, M.D.      1. Facial contusion, initial encounter   2. Fall at home, initial encounter   3. Anxiety       MDM   Patient with a mechanical fall after her alcohol consumption. Patient states that she's an alcoholic and drinks regularly. Last drink was yesterday but states she feels shaky and anxious today. Patient does have a history of anxiety.  She has evidence of ecchymosis that is healing over the right eye and into the temporal area.  She has no neuro deficits and takes no anticoagulation. C-spine is nontender. Head and facial CTs are negative for acute injury or bleed. After Ativan patient was feeling much better. Patient currently does not wish to detox. She was discharged home with 10 Ativan to take when necessary for anxiety. She was recommended to follow up with Dr. Yehuda Budd for a recheck on her anxiety and further treatment of her alcoholism when necessary.        Gwyneth Sprout, MD 12/07/12 [phone]

## 2012-12-07 NOTE — ED Notes (Signed)
Pt reports she fell striking her head on the fireplace Sunday.  Denies LOC.  Bruising noted to left eye and temporal area.

## 2013-01-04 ENCOUNTER — Telehealth: Payer: Self-pay | Admitting: Diagnostic Neuroimaging

## 2013-01-04 NOTE — Telephone Encounter (Signed)
called to resched appt 01/05/13 (reduce sched)

## 2013-01-04 NOTE — Telephone Encounter (Signed)
Spoke to pt resched 01/05/13 appt to 01/10/13, reduced sched

## 2013-01-05 ENCOUNTER — Ambulatory Visit: Payer: Self-pay | Admitting: Diagnostic Neuroimaging

## 2013-01-10 ENCOUNTER — Encounter: Payer: Self-pay | Admitting: Diagnostic Neuroimaging

## 2013-01-10 ENCOUNTER — Ambulatory Visit (INDEPENDENT_AMBULATORY_CARE_PROVIDER_SITE_OTHER): Payer: BC Managed Care – PPO | Admitting: Diagnostic Neuroimaging

## 2013-01-10 VITALS — BP 129/84 | HR 83 | Ht 67.0 in | Wt 165.0 lb

## 2013-01-10 DIAGNOSIS — R200 Anesthesia of skin: Secondary | ICD-10-CM

## 2013-01-10 DIAGNOSIS — R6889 Other general symptoms and signs: Secondary | ICD-10-CM

## 2013-01-10 DIAGNOSIS — F1011 Alcohol abuse, in remission: Secondary | ICD-10-CM

## 2013-01-10 DIAGNOSIS — G629 Polyneuropathy, unspecified: Secondary | ICD-10-CM

## 2013-01-10 DIAGNOSIS — E538 Deficiency of other specified B group vitamins: Secondary | ICD-10-CM

## 2013-01-10 DIAGNOSIS — G589 Mononeuropathy, unspecified: Secondary | ICD-10-CM

## 2013-01-10 DIAGNOSIS — R209 Unspecified disturbances of skin sensation: Secondary | ICD-10-CM

## 2013-01-10 NOTE — Patient Instructions (Addendum)
Try neuropathy cream. Check labs today.

## 2013-01-10 NOTE — Progress Notes (Signed)
[facility]  PATIENT: Heather Baldwin DOB: 28-Oct-1955  REFERRING CLINICIAN: Spear  HISTORY FROM: patient REASON FOR VISIT: new consult   HISTORICAL  CHIEF COMPLAINT:  Chief Complaint  Patient presents with  . Peripheral Neuropathy    HISTORY OF PRESENT ILLNESS:   57 year old female here for evaluation of neuropathy. Patient has prior history of B12 deficiency, Barrett's esophagus, alcohol abuse, anemia, abnormal LFTs, reflux, elevated serum iron.   For past 7 years patient has had progressive numbness and tingling, starting in the right foot now progressing to left foot. Symptoms have progressed proximally up to the midshin level. She also has some numbness and tingling in her fingertips. She describes burning, pins and needles, painful sensation. Patient had a thorough neurologic evaluation in [place] several years ago with EMG, lab testing but without definitive diagnosis other than idiopathic neuropathy. Patient developed hammertoes bilaterally approximately 2-3 years ago. Her mother also has neuropathy, of unclear cause.  Patient has been struggling with alcohol abuse over last 10-12 years. She goes several months at a time of binge drinking, up to 2 and half bottles of wine per day. Then she stops drinking for several months a time. She has previously sought treatment with outpatient therapy. Currently patient has been abstinent from alcohol for the past one week. Patient also has history of underlying anxiety disorder, is on Paxil 20 mg daily.   REVIEW OF SYSTEMS: Full 14 system review of systems performed and notable only for swelling in legs, joint pain, allergies, anxiety, headache, numbness, weakness.  ALLERGIES: No Known Allergies  HOME MEDICATIONS: Outpatient Prescriptions Prior to Visit  Medication Sig Dispense Refill  . HYDROcodone-acetaminophen (NORCO/VICODIN) 5-325 MG per tablet Take 1 tablet by mouth every 4 (four) hours as needed. For pain       . hydroxychloroquine (PLAQUENIL) 200 MG tablet       . omeprazole (PRILOSEC) 40 MG capsule Take 40 mg by mouth daily.      Marland Kitchen PARoxetine (PAXIL) 20 MG tablet Take 20 mg by mouth daily.      Marland Kitchen LORazepam (ATIVAN) 1 MG tablet Take 1 tablet (1 mg total) by mouth 3 (three) times daily as needed for anxiety.  10 tablet  0  . thiamine (VITAMIN B-1) 100 MG tablet Take 100 mg by mouth daily. Pt takes about 3 times per week       No facility-administered medications prior to visit.    PAST MEDICAL HISTORY: Past Medical History  Diagnosis Date  . Anxiety   . Anemia     trace  . GERD (gastroesophageal reflux disease)   . Barrett's esophagus   . Alcoholic     PAST SURGICAL HISTORY: Past Surgical History  Procedure Laterality Date  . Appendectomy  1999  . Laparoscopic assisted vaginal hysterectomy  2005  . Bilateral salpingoophorectomy  2005  . Right great toe surgery  07/21/2012    FAMILY HISTORY: Family History  Problem Relation Age of Onset  . Esophageal cancer Mother     SOCIAL HISTORY:  History   Social History  . Marital Status: Married    Spouse Name: N/A    Number of Children: N/A  . Years of Education: N/A   Occupational History  . Not on file.   Social History Main Topics  . Smoking status: Never Smoker   . Smokeless tobacco: Never Used  . Alcohol Use: 0.0 oz/week     Comment: pt reports she is an alcoholic and bonge drinks- she has  been on an 8 day binge drinking as much as 3 bottles of wine daily- last drink was yesterday  . Drug Use: No  . Sexually Active: Not on file   Other Topics Concern  . Not on file   Social History Narrative   Caffeine Use-2-3 cups daily.     PHYSICAL EXAM  Filed Vitals:   01/10/13 1536  BP: 129/84  Pulse: 83  Height: 5\' 7"  (1.702 m)  Weight: 165 lb (74.844 kg)   Body mass index is 25.84 kg/(m^2).  GENERAL EXAM: Patient is in no distress  CARDIOVASCULAR: Regular rate and rhythm, no murmurs, no carotid  bruits  NEUROLOGIC: MENTAL STATUS: awake, alert, language fluent, comprehension intact, naming intact CRANIAL NERVE: no papilledema on fundoscopic exam, pupils equal and reactive to light, visual fields full to confrontation, extraocular muscles intact, no nystagmus, facial sensation and strength symmetric, uvula midline, shoulder shrug symmetric, tongue midline. MOTOR: POSTURAL TREMOR IN HANDS. BILATERAL HAMMERTOES. Normal bulk and tone, full strength in the BUE, BLE SENSORY: ABSENT VIB AND PROPRIO IN THE TOES. DECR VIB PP AT ANKLES. COORDINATION: finger-nose-finger, fine finger movements normal REFLEXES: BUE 3, KNEES 3, ANKLES 0. DOWN GOING TOES. GAIT/STATION: narrow based gait; able to walk on toes, heels and tandem; romberg is POSITIVE.   DIAGNOSTIC DATA (LABS, IMAGING, TESTING) - I reviewed patient records, labs, notes, testing and imaging myself where available.  Lab Results  Component Value Date   WBC 6.2 08/25/2012   HGB 11.4* 08/25/2012   HCT 32.5* 08/25/2012   MCV 106.3* 08/25/2012   PLT 171 08/25/2012      Component Value Date/Time   NA 138 08/25/2012 1507   NA 138 06/08/2010 1855   K 3.6 08/25/2012 1507   K 3.6 06/08/2010 1855   CL 106 08/25/2012 1507   CL 99 06/08/2010 1855   CO2 25 08/25/2012 1507   CO2 30 06/08/2010 1855   GLUCOSE 124* 08/25/2012 1507   GLUCOSE 115* 06/08/2010 1855   BUN 6.0* 08/25/2012 1507   BUN 5* 06/08/2010 1855   CREATININE 0.7 08/25/2012 1507   CREATININE 0.67 06/08/2010 1855   CALCIUM 9.3 08/25/2012 1507   CALCIUM 9.3 06/08/2010 1855   PROT 7.5 08/25/2012 1507   PROT 7.5 06/08/2010 1855   ALBUMIN 3.6 08/25/2012 1507   ALBUMIN 4.3 06/08/2010 1855   AST 59* 08/25/2012 1507   AST 71* 06/08/2010 1855   ALT 34 08/25/2012 1507   ALT 78* 06/08/2010 1855   ALKPHOS 169* 08/25/2012 1507   ALKPHOS 95 06/08/2010 1855   BILITOT 1.07 08/25/2012 1507   BILITOT 1.5* 06/08/2010 1855   GFRNONAA >60 06/08/2010 1855   GFRAA  Value: >60        The eGFR has  been calculated using the MDRD equation. This calculation has not been validated in all clinical situations. eGFR's persistently <60 mL/min signify possible Chronic Kidney Disease. 06/08/2010 1855   No results found for this basename: CHOL, HDL, LDLCALC, LDLDIRECT, TRIG, CHOLHDL   No results found for this basename: HGBA1C   Lab Results  Component Value Date   VITAMINB12 488 08/25/2012   No results found for this basename: TSH       ASSESSMENT AND PLAN  57 y.o. year old female  has a past medical history of Anxiety; Anemia; GERD (gastroesophageal reflux disease); Barrett's esophagus; and Alcoholic. here with severe peripheral neuropathy. Could be related to chronic alcohol abuse vs. B12 deficiency vs. hereditary neuropathy.  Will try neuropathy cream and check b12,  TSH testing. May need to try gabapentin again. Cymbalta may be a good medicine to treat neuropathy and anxiety as well. Continue metanx.   Orders Placed This Encounter  Procedures  . Vitamin B12  . TSH     Suanne Marker, MD 01/10/2013, 4:05 PM Certified in Neurology, Neurophysiology and Neuroimaging  [facility] [address] [place], [place] [address] [phone]

## 2013-01-11 ENCOUNTER — Other Ambulatory Visit: Payer: Self-pay | Admitting: Dermatology

## 2013-01-11 LAB — TSH: TSH: 1.1 u[IU]/mL (ref 0.450–4.500)

## 2013-01-11 LAB — VITAMIN B12: Vitamin B-12: 683 pg/mL (ref 211–946)

## 2013-01-31 ENCOUNTER — Other Ambulatory Visit: Payer: Self-pay | Admitting: Family Medicine

## 2013-01-31 ENCOUNTER — Ambulatory Visit
Admission: RE | Admit: 2013-01-31 | Discharge: 2013-01-31 | Disposition: A | Discharge status: Home / Self Care | Payer: BC Managed Care – PPO | Source: Ambulatory Visit | Attending: Family Medicine | Admitting: Family Medicine

## 2013-01-31 DIAGNOSIS — M2540 Effusion, unspecified joint: Secondary | ICD-10-CM

## 2013-02-23 ENCOUNTER — Telehealth: Payer: Self-pay

## 2013-02-23 NOTE — Telephone Encounter (Signed)
Called pt to sched 4 month f/u. She requested I call back w/in the hour.

## 2013-02-23 NOTE — Telephone Encounter (Signed)
Message copied by Doree Barthel on Fri Feb 23, 2013  3:43 PM ------      Message from: Philipp Ovens R      Created: Wed Jan 10, 2013  4:09 PM      Regarding: follow up       Pt needs a 4mos follow up ------

## 2013-05-01 ENCOUNTER — Telehealth: Payer: Self-pay

## 2013-05-01 NOTE — Telephone Encounter (Signed)
Called patient to schedule 4 month f/u. No answer.

## 2013-05-21 ENCOUNTER — Ambulatory Visit: Payer: BC Managed Care – PPO | Admitting: Diagnostic Neuroimaging

## 2013-06-04 ENCOUNTER — Encounter: Payer: Self-pay | Admitting: Internal Medicine

## 2013-06-04 ENCOUNTER — Ambulatory Visit (INDEPENDENT_AMBULATORY_CARE_PROVIDER_SITE_OTHER): Payer: 59 | Admitting: Internal Medicine

## 2013-06-04 VITALS — BP 111/72 | HR 94 | Temp 97.5°F | Ht 67.0 in | Wt 181.5 lb

## 2013-06-04 DIAGNOSIS — E785 Hyperlipidemia, unspecified: Secondary | ICD-10-CM

## 2013-06-04 DIAGNOSIS — L659 Nonscarring hair loss, unspecified: Secondary | ICD-10-CM

## 2013-06-04 DIAGNOSIS — R7309 Other abnormal glucose: Secondary | ICD-10-CM

## 2013-06-04 DIAGNOSIS — M869 Osteomyelitis, unspecified: Secondary | ICD-10-CM

## 2013-06-04 DIAGNOSIS — R739 Hyperglycemia, unspecified: Secondary | ICD-10-CM

## 2013-06-04 NOTE — Progress Notes (Addendum)
Patient ID: Heather Baldwin, female   DOB: 1956-08-10, 57 y.o.   MRN: [id number]         [facility]  Reason for Consult: Osteomyelitis of the distal phalanx of right third toe Referring Physician: Dr. Marlowe Aschoff  Patient Active Problem List   Diagnosis Date Noted  . Osteomyelitis of foot 06/04/2013    Priority: High  . Dyslipidemia 06/04/2013  . Alopecia 06/04/2013  . Hyperglycemia 06/04/2013  . Neuropathy 01/10/2013  . Alcohol abuse, in remission 01/10/2013  . B12 deficiency 01/10/2013  . Barrett's esophagus   . Macrocytic anemia 08/25/2012  . GERD 03/19/2008  . IRON, SERUM, ELEVATED 03/19/2008  . LIVER FUNCTION TESTS, ABNORMAL 03/19/2008    Patient's Medications  New Prescriptions   No medications on file  Previous Medications   CIPROFLOXACIN (CIPRO) 500 MG TABLET    Take 500 mg by mouth daily.   CLINDAMYCIN (CLEOCIN) 300 MG CAPSULE    Take 300 mg by mouth 3 (three) times daily.   HYDROXYCHLOROQUINE (PLAQUENIL) 200 MG TABLET       L-METHYLFOLATE-ALGAE-B12-B6 (METANX) 3-90.314-2-35 MG CAPS    Take 1 tablet by mouth 2 (two) times daily.   OMEPRAZOLE (PRILOSEC) 40 MG CAPSULE    Take 40 mg by mouth daily.   OXYCODONE-ACETAMINOPHEN (PERCOCET) 10-325 MG PER TABLET    Take 1 tablet by mouth every 4 (four) hours as needed for pain.   PAROXETINE (PAXIL) 20 MG TABLET    Take 20 mg by mouth daily.   ZETIA 10 MG TABLET    Take 1 tablet by mouth daily.  Modified Medications   No medications on file  Discontinued Medications   HYDROCODONE-ACETAMINOPHEN (NORCO/VICODIN) 5-325 MG PER TABLET    Take 1 tablet by mouth every 4 (four) hours as needed. For pain    Recommendations: 1. Continue ciprofloxacin and clindamycin for now 2. Discuss case with Dr. Irving Shows   Assessment: Apparently Heather Baldwin developed osteomyelitis of the distal right third toe recently. She underwent resection of the distal phalanx. Operative cultures were negative but this was  probably do to prior treatment with antibiotics. It is quite possible that the surgery will lead to cure. I will continue ciprofloxacin and clindamycin for now and he'll I have a chance to discuss the situation with Dr. Irving Shows.   HPI: Heather Baldwin is a 57 y.o. female who has peripheral neuropathy and has had problems with hammertoes on her right foot. About one year ago she fractured her right great toe and required open reduction and internal fixation. She recalls having some postoperative infection and was treated at the wound Center in [facility] and received about 4 weeks of IV vancomycin with resolution of the infection and complete wound healing. Sometime earlier this year her she developed problems with her right second toe. Says that she had some surgery on that toe. She had to have her second toenail removed. That toe finally healed but then she developed a similar problem with her right third toe. She recalls that she developed blood and pus over the distal toe. She has had multiple debridements and has been on antibiotics for about 3 months, both for her foot infection and also for dental infection. She was on Augmentin initially and has been on ciprofloxacin and clindamycin for the past several weeks. She is not entirely sure of the timing of these events. Apparently the wound was not healing well and a plain film done recently showed some evidence of osteomyelitis. She  underwent resection of the distal phalanx of her right third toe on August 22. I called the laboratory and appears that no Gram stain was done. Bone cultures were negative. She has not had any fever, chills or sweats.  Review of Systems: Pertinent items are noted in HPI.    Past Medical History  Diagnosis Date  . Anxiety   . Anemia     trace  . GERD (gastroesophageal reflux disease)   . Barrett's esophagus   . Alcoholic     History  Substance Use Topics  . Smoking status: Never Smoker   . Smokeless tobacco: Never  Used  . Alcohol Use: 0.0 oz/week     Comment: pt reports she is an alcoholic and bonge drinks- she has been on an 8 day binge drinking as much as 3 bottles of wine daily- last drink was yesterday.  06/04/13 Last drink 10 weeks ago.    Family History  Problem Relation Age of Onset  . Esophageal cancer Mother    No Known Allergies  OBJECTIVE: Blood pressure 111/72, pulse 94, temperature 97.5 F (36.4 C), temperature source Oral, height 5\' 7"  (1.702 m), weight 181 lb 8 oz (82.328 kg). General: She is in no distress and in good spirits Skin: No rash Lungs: Clear Cor: Regular S1 and S2 no murmurs Abdomen: Soft and nontender Right foot: Warm and well perfused with good pulses. She has sutures in the distal incision with some dried blood. There are some mild diffuse swelling of the right third toe. There is no odor or a standing cellulitis.  Microbiology: No results found for this or any previous visit (from the past 240 hour(s)).  Cliffton Asters, MD [facility] [phone] pager   [phone] cell 06/04/2013, 5:11 PM  Addendum: I spoke with Dr. Irving Shows and she was in agreement with continuing oral ciprofloxacin and clindamycin to see if she does well after her last weeks resection of the infected bone in her right third toe. Heather Baldwin will followup with me in 2 Thereasa Parkin, MD [facility]ious Disease [facility] [phone] pager   [phone] cell 06/06/2013, 4:26 PM

## 2013-06-07 ENCOUNTER — Ambulatory Visit (INDEPENDENT_AMBULATORY_CARE_PROVIDER_SITE_OTHER): Payer: 59 | Admitting: Diagnostic Neuroimaging

## 2013-06-07 ENCOUNTER — Encounter: Payer: Self-pay | Admitting: Diagnostic Neuroimaging

## 2013-06-07 VITALS — BP 113/69 | HR 81 | Temp 97.2°F | Ht 67.0 in | Wt 181.0 lb

## 2013-06-07 DIAGNOSIS — G589 Mononeuropathy, unspecified: Secondary | ICD-10-CM

## 2013-06-07 DIAGNOSIS — G629 Polyneuropathy, unspecified: Secondary | ICD-10-CM

## 2013-06-07 NOTE — Progress Notes (Signed)
[facility]  PATIENT: Heather Baldwin DOB: [birth year]  REFERRING CLINICIAN: Spear  HISTORY FROM: patient REASON FOR VISIT: new consult   HISTORICAL  CHIEF COMPLAINT:  Chief Complaint  Patient presents with  . Follow-up    HISTORY OF PRESENT ILLNESS:   UPDATE 06/07/13: Doing better than last visit. Pain slightly better. Balance still poor (esp in shower with eyes closed). No drinking ETOH in last 10 weeks. Seeing GI for liver abnl (? Cirrhosis). Using neuropathy cream BID, with mild relief. Also getting acupuncture treatments with benefit.  PRIOR HPI (01/10/13): 57 year old female here for evaluation of neuropathy. Patient has prior history of B12 deficiency, Barrett's esophagus, alcohol abuse, anemia, abnormal LFTs, reflux, elevated serum iron.   For past 7 years patient has had progressive numbness and tingling, starting in the right foot now progressing to left foot. Symptoms have progressed proximally up to the midshin level. She also has some numbness and tingling in her fingertips. She describes burning, pins and needles, painful sensation. Patient had a thorough neurologic evaluation in [place] several years ago with EMG, lab testing but without definitive diagnosis other than idiopathic neuropathy. Patient developed hammertoes bilaterally approximately 2-3 years ago. Her mother also has neuropathy, of unclear cause.  Patient has been struggling with alcohol abuse over last 10-12 years. She goes several months at a time of binge drinking, up to 2 and half bottles of wine per day. Then she stops drinking for several months a time. She has previously sought treatment with outpatient therapy. Currently patient has been abstinent from alcohol for the past one week. Patient also has history of underlying anxiety disorder, is on Paxil 20 mg daily.   REVIEW OF SYSTEMS: Full 14 system review of systems performed and notable only for swelling in legs, joint pain,  allergies, anxiety, headache, numbness, weakness, fatigue.  ALLERGIES: No Known Allergies  HOME MEDICATIONS: Outpatient Prescriptions Prior to Visit  Medication Sig Dispense Refill  . ciprofloxacin (CIPRO) 500 MG tablet Take 500 mg by mouth daily.      . clindamycin (CLEOCIN) 300 MG capsule Take 300 mg by mouth 3 (three) times daily.      . hydroxychloroquine (PLAQUENIL) 200 MG tablet       . L-Methylfolate-Algae-B12-B6 (METANX) 3-90.314-2-35 MG CAPS Take 1 tablet by mouth 2 (two) times daily.      Marland Kitchen omeprazole (PRILOSEC) 40 MG capsule Take 40 mg by mouth daily.      Marland Kitchen oxyCODONE-acetaminophen (PERCOCET) 10-325 MG per tablet Take 1 tablet by mouth every 4 (four) hours as needed for pain.      Marland Kitchen PARoxetine (PAXIL) 20 MG tablet Take 20 mg by mouth daily.      Marland Kitchen ZETIA 10 MG tablet Take 1 tablet by mouth daily.       No facility-administered medications prior to visit.    PAST MEDICAL HISTORY: Past Medical History  Diagnosis Date  . Anxiety   . Anemia     trace  . GERD (gastroesophageal reflux disease)   . Barrett's esophagus   . Alcoholic     PAST SURGICAL HISTORY: Past Surgical History  Procedure Laterality Date  . Appendectomy  1999  . Laparoscopic assisted vaginal hysterectomy  2005  . Bilateral salpingoophorectomy  2005  . Right great toe surgery  07/21/2012    FAMILY HISTORY: Family History  Problem Relation Age of Onset  . Esophageal cancer Mother   . Leukemia Father     SOCIAL HISTORY:  History   Social  History  . Marital Status: Married    Spouse Name: Acupuncturist    Number of Children: 3  . Years of Education: MA   Occupational History  .      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 Smoking status: Never Smoker   . Smokeless tobacco: Never Used  . Alcohol Use: 0.0 oz/week     Comment: pt reports she is an alcoholic and bonge drinks- she has been on an 8 day binge drinking as much as 3 bottles of wine daily- last drink was yesterday.  06/04/13 Last  drink 10 weeks ago.  . Drug Use: No  . Sexual Activity: Not on file   Other Topics Concern  . Not on file   Social History Narrative   Patient lives at home with spouse.    Caffeine Use-2-3 cups daily.     PHYSICAL EXAM  Filed Vitals:   06/07/13 1328  BP: 113/69  Pulse: 81  Temp: 97.2 F (36.2 C)  TempSrc: Oral  Height: 5\' 7"  (1.702 m)  Weight: 181 lb (82.101 kg)   Body mass index is 28.34 kg/(m^2).  GENERAL EXAM: Patient is in no distress  CARDIOVASCULAR: Regular rate and rhythm, no murmurs, no carotid bruits  NEUROLOGIC: MENTAL STATUS: awake, alert, language fluent, comprehension intact, naming intact CRANIAL NERVE: pupils equal and reactive to light, visual fields full to confrontation, extraocular muscles intact, no nystagmus, facial sensation and strength symmetric, uvula midline, shoulder shrug symmetric, tongue midline. MOTOR: POSTURAL TREMOR IN HANDS. BILATERAL HAMMERTOES. Normal bulk and tone, full strength in the BUE, BLE SENSORY: ABSENT VIB AND PROPRIO IN THE TOES. DECR VIB, PP AT ANKLES. COORDINATION: finger-nose-finger, fine finger movements normal REFLEXES: BUE 3, KNEES 3, ANKLES 0. DOWN GOING TOES. GAIT/STATION: narrow based gait; romberg is NEGATIVE.   DIAGNOSTIC DATA (LABS, IMAGING, TESTING) - I reviewed patient records, labs, notes, testing and imaging myself where available.  Lab Results  Component Value Date   WBC 6.2 08/25/2012   HGB 11.4* 08/25/2012   HCT 32.5* 08/25/2012   MCV 106.3* 08/25/2012   PLT 171 08/25/2012      Component Value Date/Time   NA 138 08/25/2012 1507   NA 138 06/08/2010 1855   K 3.6 08/25/2012 1507   K 3.6 06/08/2010 1855   CL 106 08/25/2012 1507   CL 99 06/08/2010 1855   CO2 25 08/25/2012 1507   CO2 30 06/08/2010 1855   GLUCOSE 124* 08/25/2012 1507   GLUCOSE 115* 06/08/2010 1855   BUN 6.0* 08/25/2012 1507   BUN 5* 06/08/2010 1855   CREATININE 0.7 08/25/2012 1507   CREATININE 0.67 06/08/2010 1855   CALCIUM 9.3  08/25/2012 1507   CALCIUM 9.3 06/08/2010 1855   PROT 7.5 08/25/2012 1507   PROT 7.5 06/08/2010 1855   ALBUMIN 3.6 08/25/2012 1507   ALBUMIN 4.3 06/08/2010 1855   AST 59* 08/25/2012 1507   AST 71* 06/08/2010 1855   ALT 34 08/25/2012 1507   ALT 78* 06/08/2010 1855   ALKPHOS 169* 08/25/2012 1507   ALKPHOS 95 06/08/2010 1855   BILITOT 1.07 08/25/2012 1507   BILITOT 1.5* 06/08/2010 1855   GFRNONAA >60 06/08/2010 1855   GFRAA  Value: >60        The eGFR has been calculated using the MDRD equation. This calculation has not been validated in all clinical situations. eGFR's persistently <60 mL/min signify possible Chronic Kidney Disease. 06/08/2010 1855   No results found for this basename: CHOL,  HDL,  LDLCALC,  LDLDIRECT,  TRIG,  CHOLHDL   No results found for this basename: HGBA1C   Lab Results  Component Value Date   VITAMINB12 683 01/10/2013   Lab Results  Component Value Date   TSH 1.100 01/10/2013      ASSESSMENT AND PLAN  57 y.o. year old female  has a past medical history of Anxiety; Anemia; GERD (gastroesophageal reflux disease); Barrett's esophagus; and Alcoholic. here with severe peripheral neuropathy. 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  PLAN: 1. Continue neuropathy cream  2. Continue metanx  Return if symptoms worsen or fail to improve, for return to PCP.    Suanne Marker, MD 06/07/2013, 1:56 PM Certified in Neurology, Neurophysiology and Neuroimaging  [facility] [address] [place], [place] [address] [phone]

## 2013-06-07 NOTE — Patient Instructions (Signed)
Continue neuropathy cream.

## 2013-06-25 ENCOUNTER — Ambulatory Visit: Payer: 59 | Admitting: Internal Medicine

## 2013-07-10 ENCOUNTER — Ambulatory Visit: Payer: 59 | Admitting: Internal Medicine

## 2013-07-19 ENCOUNTER — Emergency Department (HOSPITAL_COMMUNITY): Payer: 59

## 2013-07-19 ENCOUNTER — Encounter (HOSPITAL_COMMUNITY): Payer: Self-pay | Admitting: Emergency Medicine

## 2013-07-19 ENCOUNTER — Emergency Department (HOSPITAL_COMMUNITY)
Admission: EM | Admit: 2013-07-19 | Discharge: 2013-07-19 | Disposition: A | Discharge status: Home / Self Care | Payer: 59 | Attending: Emergency Medicine | Admitting: Emergency Medicine

## 2013-07-19 DIAGNOSIS — K219 Gastro-esophageal reflux disease without esophagitis: Secondary | ICD-10-CM | POA: Insufficient documentation

## 2013-07-19 DIAGNOSIS — Z79899 Other long term (current) drug therapy: Secondary | ICD-10-CM | POA: Insufficient documentation

## 2013-07-19 DIAGNOSIS — S92109A Unspecified fracture of unspecified talus, initial encounter for closed fracture: Secondary | ICD-10-CM | POA: Insufficient documentation

## 2013-07-19 DIAGNOSIS — F411 Generalized anxiety disorder: Secondary | ICD-10-CM | POA: Insufficient documentation

## 2013-07-19 DIAGNOSIS — F1010 Alcohol abuse, uncomplicated: Secondary | ICD-10-CM | POA: Insufficient documentation

## 2013-07-19 DIAGNOSIS — W010XXA Fall on same level from slipping, tripping and stumbling without subsequent striking against object, initial encounter: Secondary | ICD-10-CM | POA: Insufficient documentation

## 2013-07-19 DIAGNOSIS — Z862 Personal history of diseases of the blood and blood-forming organs and certain disorders involving the immune mechanism: Secondary | ICD-10-CM | POA: Insufficient documentation

## 2013-07-19 DIAGNOSIS — S82892A Other fracture of left lower leg, initial encounter for closed fracture: Secondary | ICD-10-CM

## 2013-07-19 DIAGNOSIS — Y92009 Unspecified place in unspecified non-institutional (private) residence as the place of occurrence of the external cause: Secondary | ICD-10-CM | POA: Insufficient documentation

## 2013-07-19 DIAGNOSIS — S0990XA Unspecified injury of head, initial encounter: Secondary | ICD-10-CM | POA: Insufficient documentation

## 2013-07-19 DIAGNOSIS — R0602 Shortness of breath: Secondary | ICD-10-CM | POA: Insufficient documentation

## 2013-07-19 DIAGNOSIS — S2239XA Fracture of one rib, unspecified side, initial encounter for closed fracture: Secondary | ICD-10-CM | POA: Insufficient documentation

## 2013-07-19 DIAGNOSIS — S2232XA Fracture of one rib, left side, initial encounter for closed fracture: Secondary | ICD-10-CM

## 2013-07-19 DIAGNOSIS — Y939 Activity, unspecified: Secondary | ICD-10-CM | POA: Insufficient documentation

## 2013-07-19 DIAGNOSIS — Z792 Long term (current) use of antibiotics: Secondary | ICD-10-CM | POA: Insufficient documentation

## 2013-07-19 HISTORY — DX: Unspecified cirrhosis of liver: K74.60

## 2013-07-19 MED ORDER — FENTANYL CITRATE 0.05 MG/ML IJ SOLN
INTRAMUSCULAR | Status: AC
  Administered 2013-07-19: 50 ug via INTRAVENOUS
  Filled 2013-07-19: qty 2

## 2013-07-19 MED ORDER — SODIUM CHLORIDE 0.9 % IV BOLUS (SEPSIS)
1000.0000 mL | Freq: Once | INTRAVENOUS | Status: AC
  Administered 2013-07-19: 1000 mL via INTRAVENOUS

## 2013-07-19 MED ORDER — LORAZEPAM 1 MG PO TABS: 1.0000 mg | ORAL_TABLET | Freq: Three times a day (TID) | ORAL | Status: AC | PRN

## 2013-07-19 MED ORDER — FENTANYL CITRATE 0.05 MG/ML IJ SOLN
50.0000 ug | Freq: Once | INTRAMUSCULAR | Status: AC
  Administered 2013-07-19: 50 ug via INTRAVENOUS
  Filled 2013-07-19: qty 2

## 2013-07-19 MED ORDER — FENTANYL CITRATE 0.05 MG/ML IJ SOLN: 50.0000 ug | Freq: Once | INTRAMUSCULAR | Status: AC

## 2013-07-19 MED ORDER — FENTANYL CITRATE 0.05 MG/ML IJ SOLN
INTRAMUSCULAR | Status: AC
  Filled 2013-07-19: qty 2

## 2013-07-19 MED ORDER — LORAZEPAM 1 MG PO TABS: 1.0000 mg | ORAL_TABLET | Freq: Three times a day (TID) | ORAL | Status: DC | PRN

## 2013-07-19 MED ORDER — TRAMADOL HCL 50 MG PO TABS: 50.0000 mg | ORAL_TABLET | Freq: Four times a day (QID) | ORAL | Status: AC | PRN

## 2013-07-19 MED ORDER — TRAMADOL HCL 50 MG PO TABS: 50.0000 mg | ORAL_TABLET | Freq: Four times a day (QID) | ORAL | Status: DC | PRN

## 2013-07-19 MED ORDER — LORAZEPAM 2 MG/ML IJ SOLN: 1.0000 mg | Freq: Once | INTRAMUSCULAR | Status: DC

## 2013-07-19 NOTE — ED Notes (Addendum)
Pt. tripped / lost balance and fell at home last night , no LOC ,pt. admitted drinking ETOH last night , presents with left ankle pain /swelling and left anterior ribcage pain , respirations unlabored.

## 2013-07-19 NOTE — ED Notes (Addendum)
Ortho paged and responded for Cam Walker/Crutches

## 2013-07-19 NOTE — ED Provider Notes (Signed)
Medical screening examination/treatment/procedure(s) were performed by non-physician practitioner and as supervising physician I was immediately available for consultation/collaboration.   Shelda Jakes, MD 07/19/13 2023

## 2013-07-19 NOTE — Progress Notes (Signed)
Orthopedic Tech Progress Note Patient Details:  Heather Baldwin [birth year] [id number]  Ortho Devices Type of Ortho Device: CAM walker;Crutches Ortho Device/Splint Location: LLE Ortho Device/Splint Interventions: Ordered;Application   Jennye Moccasin 07/19/2013, 7:17 PM

## 2013-07-19 NOTE — ED Notes (Signed)
Pt in radiology notified to bring patient to POD E 42.

## 2013-07-19 NOTE — ED Provider Notes (Signed)
CSN: [id number]     Arrival date & time 07/19/13  1508 History   First MD Initiated Contact with Patient 07/19/13 1556     Chief Complaint  Patient presents with  . Fall   (Consider location/radiation/quality/duration/timing/severity/associated sxs/prior Treatment) HPI Comments: Patient is a 57 year old female with history of anxiety, anemia, GERD, Barrett's esophagus, alcoholism, liver cirrhosis who presents today after a fall last night. She is unsure of how she fell because she was quite intoxicated and blacked out. She woke up this morning with a headache, left ankle pain, left rib pain. The pain in her rib is sharp and worse with inspiration. Her left ankle pain is also sharp and worse with walking. She has been unable to ambulate on the left foot. She reports her headache is only mild and dull. She has continued to drink this morning. She drinks approximately one bottle of vodka and one bottle of wine daily. She has only been drinking for the past 5 days. Her last drink was just prior to arrival. She would like to detox from alcohol. Her husband reports that this has been a chronic issue and she "falls off the wagon" every 6 months. She is seen in the emergency department given some fluids and some Ativan which starts the process of her becoming sober again. She does not want to talk to anyone about her resources in the community. She reports that she has already done this and knows what they are. No suicidal or homicidal ideations.  Patient is a 57 y.o. female presenting with fall. The history is provided by the patient. No language interpreter was used.  Fall Associated symptoms include arthralgias, headaches, joint swelling and myalgias. Pertinent negatives include no abdominal pain, chest pain, chills, fever, nausea or vomiting.    Past Medical History  Diagnosis Date  . Anxiety   . Anemia     trace  . GERD (gastroesophageal reflux disease)   . Barrett's esophagus   . Alcoholic   .  Liver cirrhosis    Past Surgical History  Procedure Laterality Date  . Appendectomy  1999  . Laparoscopic assisted vaginal hysterectomy  2005  . Bilateral salpingoophorectomy  2005  . Right great toe surgery  07/21/2012   Family History  Problem Relation Age of Onset  . Esophageal cancer Mother   . Leukemia Father    History  Substance Use Topics  . Smoking status: Never Smoker   . Smokeless tobacco: Never Used  . Alcohol Use: 0.0 oz/week     Comment: pt reports she is an alcoholic and bonge drinks- she has been on an 8 day binge drinking as much as 3 bottles of wine daily- last drink was yesterday.  06/04/13 Last drink 10 weeks ago.   OB History   Grav Para Term Preterm Abortions TAB SAB Ect Mult Living                 Review of Systems  Constitutional: Negative for fever and chills.  Respiratory: Positive for shortness of breath (only with deep inspiration).   Cardiovascular: Negative for chest pain.  Gastrointestinal: Negative for nausea, vomiting and abdominal pain.  Musculoskeletal: Positive for arthralgias, gait problem, joint swelling and myalgias.  Neurological: Positive for headaches.  All other systems reviewed and are negative.    Allergies  Review of patient's allergies indicates no known allergies.  Home Medications   Current Outpatient Rx  Name  Route  Sig  Dispense  Refill  . ciprofloxacin (CIPRO) 500  MG tablet   Oral   Take 500 mg by mouth daily.         . clindamycin (CLEOCIN) 300 MG capsule   Oral   Take 300 mg by mouth 3 (three) times daily.         . hydroxychloroquine (PLAQUENIL) 200 MG tablet               . L-Methylfolate-Algae-B12-B6 (METANX) 3-90.314-2-35 MG CAPS   Oral   Take 1 tablet by mouth 2 (two) times daily.         Marland Kitchen omeprazole (PRILOSEC) 40 MG capsule   Oral   Take 40 mg by mouth daily.         Marland Kitchen oxyCODONE-acetaminophen (PERCOCET) 10-325 MG per tablet   Oral   Take 1 tablet by mouth every 4 (four) hours as  needed for pain.         Marland Kitchen PARoxetine (PAXIL) 20 MG tablet   Oral   Take 20 mg by mouth daily.          BP 111/71  Pulse 113  Temp(Src) 99.1 F (37.3 C) (Oral)  Resp 16  SpO2 99% Physical Exam  Nursing note and vitals reviewed. Constitutional: She is oriented to person, place, and time. She appears well-developed and well-nourished. No distress.  HENT:  Head: Normocephalic and atraumatic.  Right Ear: External ear normal.  Left Ear: External ear normal.  Nose: Nose normal.  Mouth/Throat: Oropharynx is clear and moist.  Tongue darkened from red wine  Eyes: Conjunctivae and EOM are normal. Pupils are equal, round, and reactive to light.  Neck: Trachea normal, normal range of motion and phonation normal. No spinous process tenderness and no muscular tenderness present. No rigidity.  Cardiovascular: Normal rate, regular rhythm, normal heart sounds, intact distal pulses and normal pulses.   Capillary refill less than 3 seconds in all toes  Pulmonary/Chest: Effort normal and breath sounds normal. No stridor. No respiratory distress. She has no wheezes. She has no rales.  Tender to palpation over the left 7th rib  Abdominal: Soft. She exhibits no distension.  Musculoskeletal: Normal range of motion.  Tender to palpation over lateral malleolus on left. Significant amount of swelling and bruising to left ankle. Bruising and swelling to left great toe. Compartment soft. Neurovascularly intact. There is some sensation deficit which patient states is chronic.  Neurological: She is alert and oriented to person, place, and time. She has normal strength.  Skin: Skin is warm and dry. She is not diaphoretic. No erythema.  Psychiatric: She has a normal mood and affect. Her behavior is normal. She expresses no homicidal and no suicidal ideation.    ED Course  Procedures (including critical care time) Labs Review Labs Reviewed - No data to display Imaging Review Dg Ribs Unilateral W/chest  Left  07/19/2013   CLINICAL DATA:  Left anterior rib pain under breast, fell last night  EXAM: LEFT RIBS AND CHEST - 3+ VIEW  COMPARISON:  None  FINDINGS: Normal heart size, mediastinal contours, and pulmonary vascularity.  Peribronchial thickening.  Lungs otherwise clear.  No pleural effusion or pneumothorax.  Minimal biapical scarring greater on right.  BB placed at site of symptoms lower left chest.  Bones appear slightly demineralized.  Nondisplaced fracture anterior left 7th rib.  No definite additional fracture identified.  IMPRESSION: Nondisplaced anterior left 7th rib fracture.   Electronically Signed   By: Ulyses Southward M.D.   On: 07/19/2013 16:30   Dg Ankle Complete Left  07/19/2013   CLINICAL DATA:  Left ankle pain, swelling and bruising laterally, fell last night  EXAM: LEFT ANKLE COMPLETE - 3+ VIEW  COMPARISON:  None  FINDINGS: Significant soft tissue swelling laterally.  Bony densities are identified adjacent to the lateral talus and the tip of the lateral malleolus, cannot exclude avulsion fracture particularly from the lateral margin of the talus.  Ankle mortise intact.  Osseous mineralization normal.  No additional fracture, dislocation, or bone destruction.  Scattered small vessel vascular calcifications.  Calcaneal spurring.  Dorsal spurring at the TMT joints on lateral view.  IMPRESSION: Lateral soft tissue swelling with question avulsion fracture at the lateral ankle, potentially from the lateral margin of the talus.   Electronically Signed   By: Ulyses Southward M.D.   On: 07/19/2013 16:27   Ct Head Wo Contrast  07/19/2013   CLINICAL DATA:  Severe headache. Fall last night. No loss of consciousness. Ethanol use before fall.  EXAM: CT HEAD WITHOUT CONTRAST  CT CERVICAL SPINE WITHOUT CONTRAST  TECHNIQUE: Multidetector CT imaging of the head and cervical spine was performed following the standard protocol without intravenous contrast. Multiplanar CT image reconstructions of the cervical spine were  also generated.  COMPARISON:  None.  FINDINGS: CT HEAD FINDINGS  There is high attenuation in the intravascular compartment along the superior sagittal sinus, likely representing dehydration. No secondary signs of venous infarction are identified. The calvarium is intact. The paranasal sinuses and mastoid air cells appear within normal limits. The globes appear normal. No mass lesion, mass effect, midline shift, hydrocephalus, hemorrhage. No territorial ischemia or acute infarction.  CT CERVICAL SPINE FINDINGS  There is no spondylolisthesis. The patient is tilted in the scanner. There is no cervical spine fracture or dislocation. Multilevel degenerative disc and facet disease is present. This is most pronounced from C4-C5 through C6-C7. The prevertebral soft tissues are normal. Craniocervical alignment is normal. The odontoid is intact. Occipital condyles appear within normal limits. Carotid atherosclerosis is incidentally noted.  IMPRESSION: CT HEAD IMPRESSION  Negative CT head.  CT CERVICAL SPINE IMPRESSION  Cervical degenerative disk and joint disease. No fracture or acute osseous abnormality.   Electronically Signed   By: Andreas Newport M.D.   On: 07/19/2013 18:29   Ct Cervical Spine Wo Contrast  07/19/2013   CLINICAL DATA:  Severe headache. Fall last night. No loss of consciousness. Ethanol use before fall.  EXAM: CT HEAD WITHOUT CONTRAST  CT CERVICAL SPINE WITHOUT CONTRAST  TECHNIQUE: Multidetector CT imaging of the head and cervical spine was performed following the standard protocol without intravenous contrast. Multiplanar CT image reconstructions of the cervical spine were also generated.  COMPARISON:  None.  FINDINGS: CT HEAD FINDINGS  There is high attenuation in the intravascular compartment along the superior sagittal sinus, likely representing dehydration. No secondary signs of venous infarction are identified. The calvarium is intact. The paranasal sinuses and mastoid air cells appear within  normal limits. The globes appear normal. No mass lesion, mass effect, midline shift, hydrocephalus, hemorrhage. No territorial ischemia or acute infarction.  CT CERVICAL SPINE FINDINGS  There is no spondylolisthesis. The patient is tilted in the scanner. There is no cervical spine fracture or dislocation. Multilevel degenerative disc and facet disease is present. This is most pronounced from C4-C5 through C6-C7. The prevertebral soft tissues are normal. Craniocervical alignment is normal. The odontoid is intact. Occipital condyles appear within normal limits. Carotid atherosclerosis is incidentally noted.  IMPRESSION: CT HEAD IMPRESSION  Negative CT head.  CT  CERVICAL SPINE IMPRESSION  Cervical degenerative disk and joint disease. No fracture or acute osseous abnormality.   Electronically Signed   By: Andreas Newport M.D.   On: 07/19/2013 18:29   Dg Foot Complete Left  07/19/2013   CLINICAL DATA:  Tripped, left lateral ankle pain  EXAM: LEFT FOOT - COMPLETE 3+ VIEW  COMPARISON:  Concurrently obtained radiographs of the ankle  FINDINGS: No acute fracture or malalignment identified. Small avulsion fracture seen on the dedicated ankle series is not well seen on these radiographs. Mild midfoot osteoarthritis. Irregularity of the base of the proximal phalanx of the 2nd toe consistent with remote healed trauma. Incidental note is made of an os peroneus. Atherosclerotic calcifications in the small vessels of the foot.  IMPRESSION: No acute fracture or malalignment on these views.   Electronically Signed   By: Malachy Moan M.D.   On: 07/19/2013 18:07    EKG Interpretation   None       MDM   1. Avulsion fracture of left ankle   2. Rib fracture, left, closed, initial encounter   3. Alcohol abuse    Patient presents today with avulsion fracture of left ankle and left rib fracture of the seventh rib. She sustained these injuries with a fall while drunk. She does not remember the fall and continued to  drink. Last drink was just PTA. Head CT and CT cervical spine were both negative for acute pathology. The patient was like detox from alcohol. She was given a short course of Ativan to help with this as an outpatient. She denies any suicidal or homicidal ideations. She is neurovascularly intact and the compartment is soft in her left ankle. She is placed in a cam walker and given crutches. She was given orthopedic follow up. She was given an incentive spirometer for her rib fracture. Discussed that she needs to take deep breaths every hour while awake. Discussed that complications of rib fracture include atelectasis and pneumonia from not taking deep breaths. She was sent home with tramadol for her pain control as I am concerned that she will combine narcotics with alcohol. Return injection given. Vital signs stable for discharge. Patient / Family / Caregiver informed of clinical course, understand medical decision-making process, and agree with plan.       Mora Bellman, PA-C 07/19/13 1949

## 2013-07-19 NOTE — ED Notes (Signed)
Pt is requesting she would like to detox from alcohol.

## 2013-07-20 ENCOUNTER — Telehealth: Payer: Self-pay | Admitting: *Deleted

## 2013-07-20 NOTE — Telephone Encounter (Signed)
Pt states has a fractured ankle and rib, and the pain medication the ED gave is not working.  Pt asked if Dr Irving Shows could writing for Dilaudid.  I called pt and informed she should call the doctor who treated this problem and request a change of medication.  Pt states has an orthopedic appt on 07/23/2013 so she'll wait.

## 2013-08-16 ENCOUNTER — Other Ambulatory Visit: Payer: Self-pay

## 2013-10-01 ENCOUNTER — Other Ambulatory Visit: Payer: Self-pay | Admitting: Dermatology

## 2014-03-18 ENCOUNTER — Other Ambulatory Visit: Payer: Self-pay | Admitting: Obstetrics and Gynecology

## 2014-03-19 LAB — CYTOLOGY - PAP

## 2014-05-10 ENCOUNTER — Telehealth: Payer: Self-pay | Admitting: *Deleted

## 2014-05-10 NOTE — Telephone Encounter (Signed)
Refill request Metanx twice a day, 90 day supply.  We tried to send it electronically but it kept saying denied.

## 2014-05-15 NOTE — Telephone Encounter (Signed)
Had a prescription for Metanx prescribed by Dr. Leeanne Deeduchman.  I had one refill left but it expired in March.  Can you authorize the refill?  [facility] have tried to contact you several times.  Can you refill it this once then I can schedule to come in?

## 2014-05-15 NOTE — Telephone Encounter (Signed)
Sure that's fine to refill it-- if it isn't in the system we may have to hand rx it-- also the metanx rep was here not too long ago with coupons for the medication. May be nice to add that to the rx.  thanks

## 2014-05-16 NOTE — Telephone Encounter (Signed)
I called and informed her that it's okay to refill the Metanx per Dr. Marlowe AschoffKathryn Egerton.

## 2014-05-16 NOTE — Telephone Encounter (Signed)
I called and informed the patient that Dr. Dimas AguasEgerton okayed the refill for the Metanx.  Sorry for the delay, she has been out of the office.  She stated okay thank you.

## 2014-05-27 ENCOUNTER — Encounter: Payer: Self-pay | Admitting: Internal Medicine

## 2014-07-24 ENCOUNTER — Other Ambulatory Visit (HOSPITAL_COMMUNITY): Payer: Self-pay | Admitting: Orthopedic Surgery

## 2014-07-24 ENCOUNTER — Ambulatory Visit (HOSPITAL_COMMUNITY)
Admission: RE | Admit: 2014-07-24 | Discharge: 2014-07-24 | Disposition: A | Discharge status: Home / Self Care | Payer: 59 | Source: Ambulatory Visit | Attending: Orthopedic Surgery | Admitting: Orthopedic Surgery

## 2014-07-24 DIAGNOSIS — M25562 Pain in left knee: Secondary | ICD-10-CM | POA: Insufficient documentation

## 2014-07-24 DIAGNOSIS — M7989 Other specified soft tissue disorders: Secondary | ICD-10-CM

## 2014-07-24 DIAGNOSIS — M79609 Pain in unspecified limb: Secondary | ICD-10-CM

## 2014-07-24 NOTE — Progress Notes (Signed)
Left lower extremity venous duplex completed.  Left:  No evidence of DVT, superficial thrombosis, or Baker's cyst.  Right:  Negative for DVT in the common femoral vein.  

## 2014-10-14 ENCOUNTER — Encounter: Payer: Self-pay | Admitting: Internal Medicine

## 2015-05-12 IMAGING — CT CT HEAD W/O CM
3 of 4 series · 14 of 47 positions shown, 16 images · non-contrast
Comparison: None.

CLINICAL DATA: Severe headache. Fall last night. No loss of
consciousness. Ethanol use before fall.

EXAM:
CT HEAD WITHOUT CONTRAST
CT CERVICAL SPINE WITHOUT CONTRAST
TECHNIQUE: Multidetector CT imaging of the head and cervical spine was
performed following the standard protocol without intravenous
contrast. Multiplanar CT image reconstructions of the cervical spine
were also generated.

[Series 4: coronals · coronal · 0.23mm/px · 3 of 51 slices shown]
[im 17/51  brain]
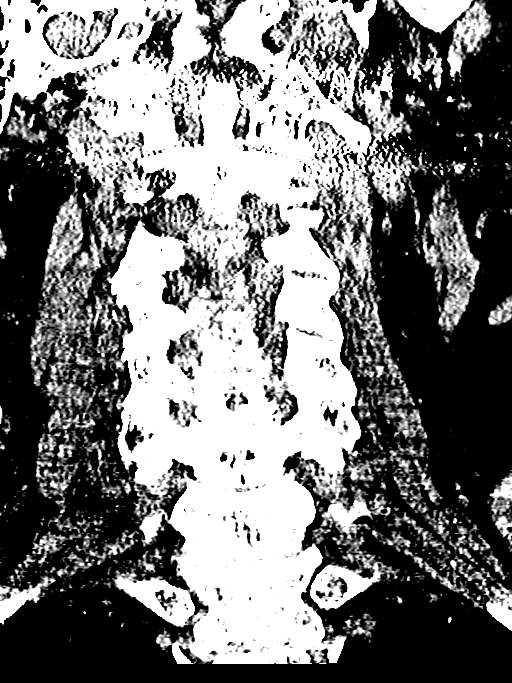
[im 23/51  brain]
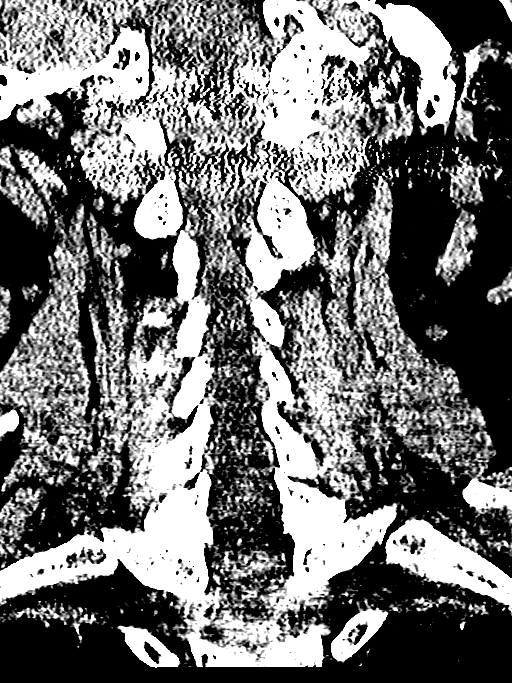
[im 28/51  brain]
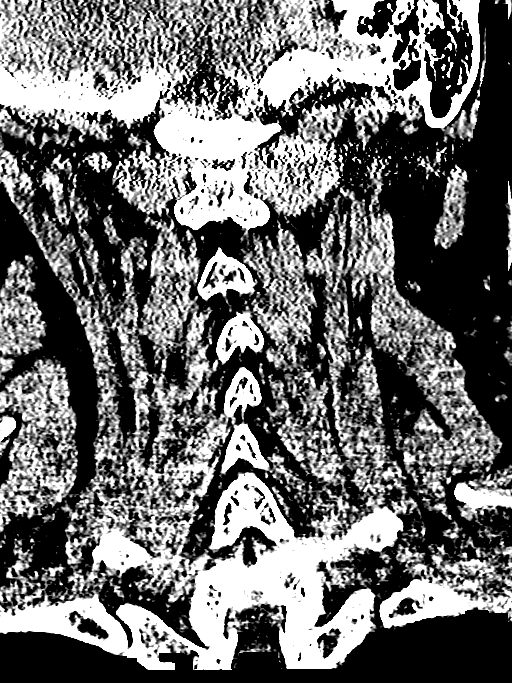

[Series 5: sagittals · sagittal · 0.23mm/px · 3 of 48 slices shown]
[im 16/48  brain]
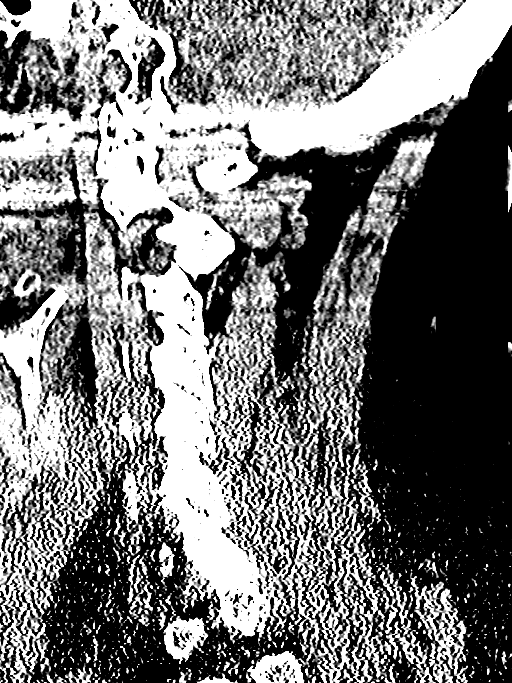
[im 24/48  brain]
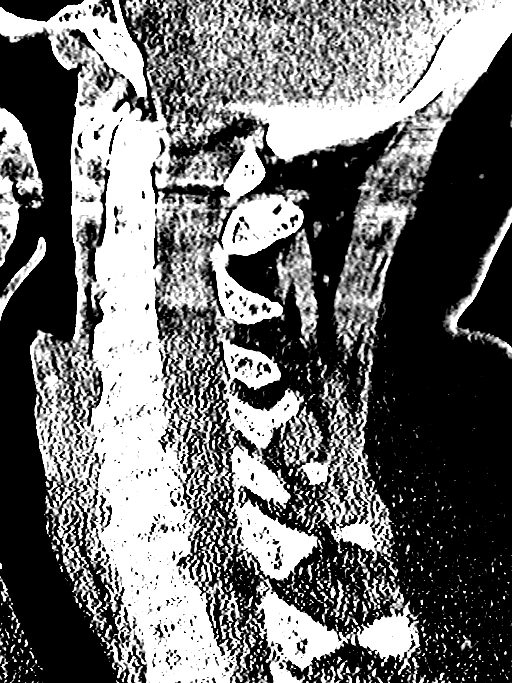
[im 32/48  brain]
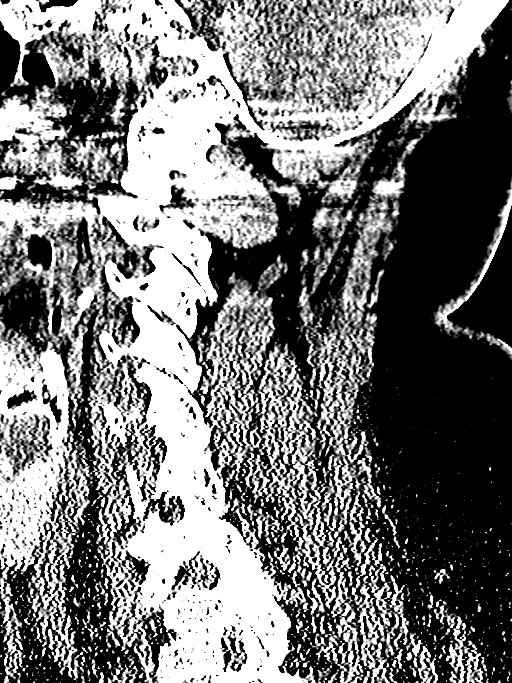

[Series 6: orthogonals · axial · 0.23mm/px · z∈[-330,-207]mm · 8 of 74 slices shown, 10 images]
[im 6/74  brain]
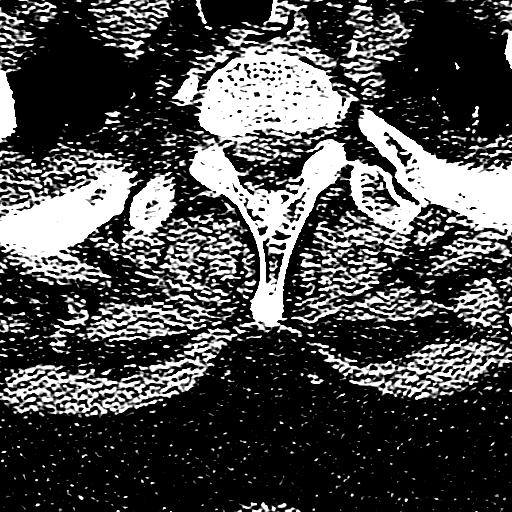
[im 6/74  bone]
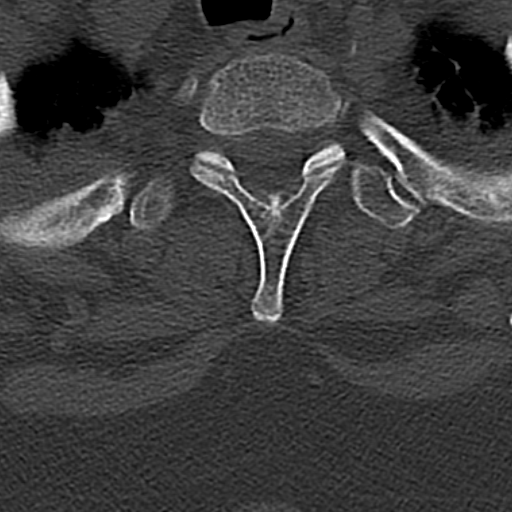
[im 17/74  brain]
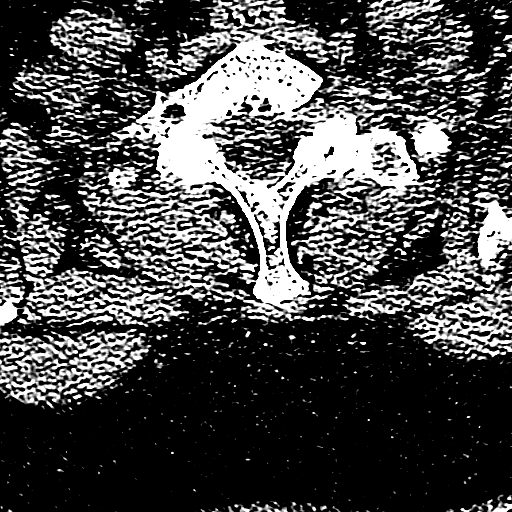
[im 23/74  brain]
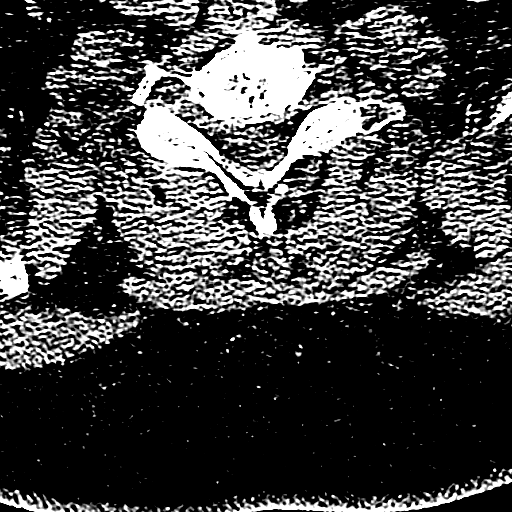
[im 34/74  brain]
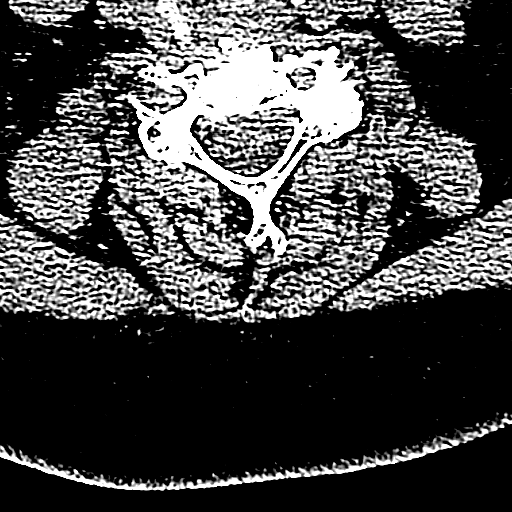
[im 40/74  brain]
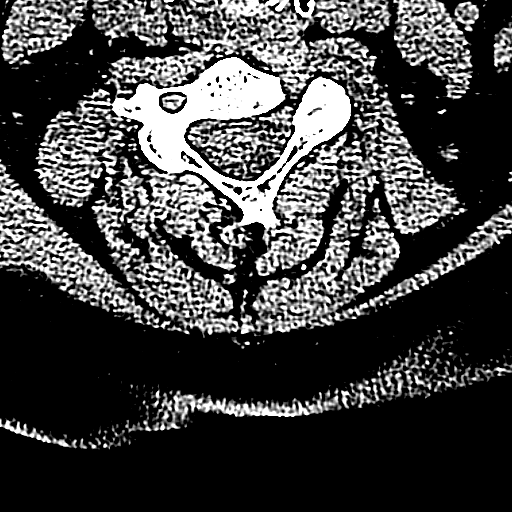
[im 40/74  bone]
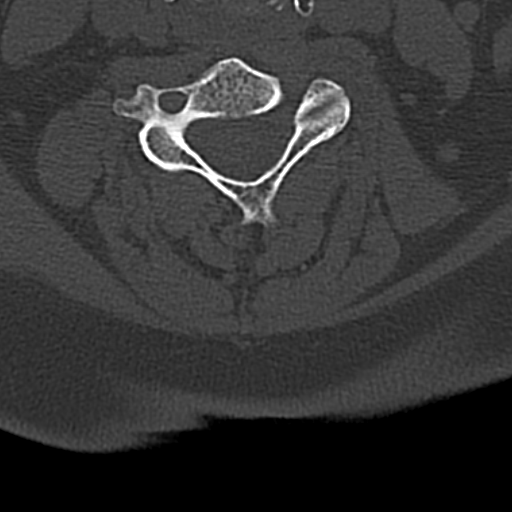
[im 51/74  brain]
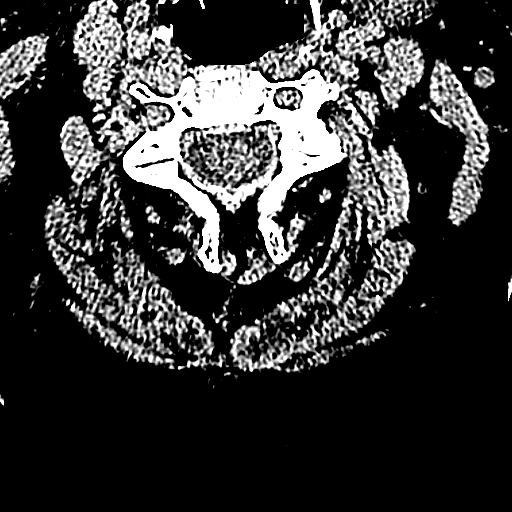
[im 57/74  brain]
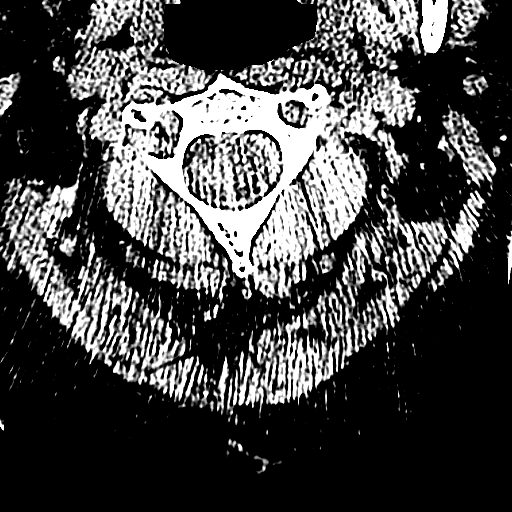
[im 68/74  brain]
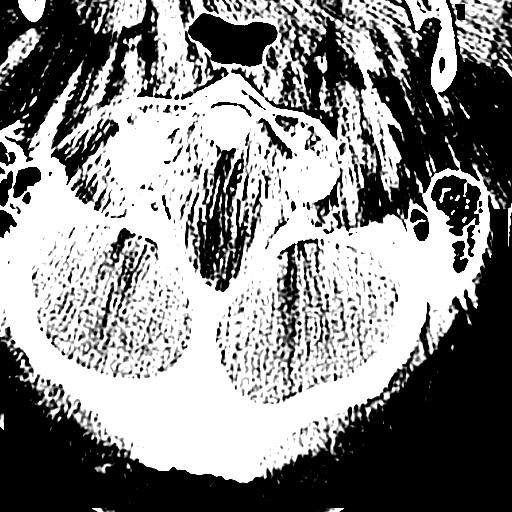

[14 of 47 positions shown; findings below may reference images not displayed]

FINDINGS: CT HEAD FINDINGS

There is high attenuation in the intravascular compartment along the
superior sagittal sinus, likely representing dehydration. No
secondary signs of venous infarction are identified. The calvarium
is intact. The paranasal sinuses and mastoid air cells appear within
normal limits. The globes appear normal. No mass lesion, mass
effect, midline shift, hydrocephalus, hemorrhage. No territorial
ischemia or acute infarction.

CT CERVICAL SPINE FINDINGS

There is no spondylolisthesis. The patient is tilted in the scanner.
There is no cervical spine fracture or dislocation. Multilevel
degenerative disc and facet disease is present. This is most
pronounced from C4-C5 through C6-C7. The prevertebral soft tissues
are normal. Craniocervical alignment is normal. The odontoid is
intact. Occipital condyles appear within normal limits. Carotid
atherosclerosis is incidentally noted.
IMPRESSION: CT HEAD IMPRESSION

Negative CT head.

CT CERVICAL SPINE IMPRESSION

Cervical degenerative disk and joint disease. No fracture or acute
osseous abnormality.

## 2017-11-11 ENCOUNTER — Encounter: Payer: Self-pay | Admitting: Internal Medicine
